# Patient Record
Sex: Male | Born: 1960 | Race: White | Hispanic: No | Marital: Married | Smoking: Never smoker
Health system: Southern US, Community
[De-identification: ages and names within clinical notes are randomized; demographics above are authoritative.]

## PROBLEM LIST (undated history)

## (undated) DIAGNOSIS — E785 Hyperlipidemia, unspecified: Secondary | ICD-10-CM

## (undated) DIAGNOSIS — G4730 Sleep apnea, unspecified: Secondary | ICD-10-CM

## (undated) DIAGNOSIS — E119 Type 2 diabetes mellitus without complications: Secondary | ICD-10-CM

## (undated) DIAGNOSIS — F4310 Post-traumatic stress disorder, unspecified: Secondary | ICD-10-CM

## (undated) DIAGNOSIS — K219 Gastro-esophageal reflux disease without esophagitis: Secondary | ICD-10-CM

## (undated) DIAGNOSIS — I10 Essential (primary) hypertension: Secondary | ICD-10-CM

## (undated) HISTORY — PX: TONSILLECTOMY: SUR1361

## (undated) HISTORY — PX: OTHER SURGICAL HISTORY: SHX169

## (undated) HISTORY — PX: ADENOIDECTOMY: SUR15

---

## 2007-04-16 ENCOUNTER — Ambulatory Visit: Payer: Self-pay | Admitting: Internal Medicine

## 2010-05-13 ENCOUNTER — Emergency Department: Payer: Self-pay | Admitting: Emergency Medicine

## 2010-11-07 ENCOUNTER — Emergency Department: Payer: Self-pay | Admitting: Emergency Medicine

## 2010-11-22 ENCOUNTER — Ambulatory Visit: Payer: Self-pay

## 2011-02-07 ENCOUNTER — Inpatient Hospital Stay: Payer: Self-pay | Admitting: Internal Medicine

## 2011-06-26 ENCOUNTER — Ambulatory Visit: Payer: Self-pay | Admitting: Internal Medicine

## 2011-07-04 ENCOUNTER — Ambulatory Visit: Payer: Self-pay | Admitting: Internal Medicine

## 2011-08-04 ENCOUNTER — Ambulatory Visit: Payer: Self-pay | Admitting: Internal Medicine

## 2011-09-01 ENCOUNTER — Ambulatory Visit: Payer: Self-pay | Admitting: Internal Medicine

## 2012-11-24 ENCOUNTER — Observation Stay: Payer: Self-pay | Admitting: Student

## 2012-11-24 LAB — CBC
HGB: 16.3 g/dL (ref 13.0–18.0)
MCH: 32.3 pg (ref 26.0–34.0)
Platelet: 180 10*3/uL (ref 150–440)
RBC: 5.05 10*6/uL (ref 4.40–5.90)
RDW: 12.9 % (ref 11.5–14.5)

## 2012-11-24 LAB — COMPREHENSIVE METABOLIC PANEL
Albumin: 3.7 g/dL (ref 3.4–5.0)
Alkaline Phosphatase: 117 U/L (ref 50–136)
Anion Gap: 6 — ABNORMAL LOW (ref 7–16)
BUN: 18 mg/dL (ref 7–18)
Calcium, Total: 8.8 mg/dL (ref 8.5–10.1)
Co2: 25 mmol/L (ref 21–32)
Creatinine: 0.92 mg/dL (ref 0.60–1.30)
Glucose: 234 mg/dL — ABNORMAL HIGH (ref 65–99)
Osmolality: 280 (ref 275–301)
Potassium: 3.6 mmol/L (ref 3.5–5.1)
SGOT(AST): 40 U/L — ABNORMAL HIGH (ref 15–37)
SGPT (ALT): 57 U/L (ref 12–78)
Sodium: 135 mmol/L — ABNORMAL LOW (ref 136–145)
Total Protein: 7 g/dL (ref 6.4–8.2)

## 2012-11-24 LAB — CK TOTAL AND CKMB (NOT AT ARMC): CK-MB: 2.5 ng/mL (ref 0.5–3.6)

## 2012-11-24 LAB — PRO B NATRIURETIC PEPTIDE: B-Type Natriuretic Peptide: 10 pg/mL (ref 0–125)

## 2012-11-24 LAB — TROPONIN I: Troponin-I: <0.02 ng/mL

## 2012-11-25 LAB — URINALYSIS, COMPLETE
Bilirubin,UR: NEGATIVE
Blood: NEGATIVE
Glucose,UR: >=500 mg/dL (ref 0–75)
Ph: 5 (ref 4.5–8.0)
Specific Gravity: 1.022 (ref 1.003–1.030)
Squamous Epithelial: 1

## 2012-11-25 LAB — TROPONIN I
Troponin-I: <0.02 ng/mL
Troponin-I: <0.02 ng/mL

## 2012-11-25 LAB — CBC WITH DIFFERENTIAL/PLATELET
Basophil #: 0.1 10*3/uL (ref 0.0–0.1)
Basophil %: 0.9 %
Eosinophil #: 0.4 10*3/uL (ref 0.0–0.7)
Eosinophil %: 5.4 %
HCT: 43.6 % (ref 40.0–52.0)
HGB: 15.7 g/dL (ref 13.0–18.0)
Lymphocyte #: 2.5 10*3/uL (ref 1.0–3.6)
Lymphocyte %: 36.1 %
MCH: 32.3 pg (ref 26.0–34.0)
MCHC: 36.1 g/dL — ABNORMAL HIGH (ref 32.0–36.0)
Neutrophil %: 48.5 %
RBC: 4.88 10*6/uL (ref 4.40–5.90)
RDW: 13 % (ref 11.5–14.5)
WBC: 6.9 10*3/uL (ref 3.8–10.6)

## 2012-11-25 LAB — LIPID PANEL: HDL Cholesterol: 18 mg/dL — ABNORMAL LOW (ref 40–60)

## 2012-11-25 LAB — BASIC METABOLIC PANEL
Anion Gap: 5 — ABNORMAL LOW (ref 7–16)
Co2: 29 mmol/L (ref 21–32)
Creatinine: 0.84 mg/dL (ref 0.60–1.30)
EGFR (Non-African Amer.): >60
Glucose: 251 mg/dL — ABNORMAL HIGH (ref 65–99)
Osmolality: 288 (ref 275–301)

## 2012-11-25 LAB — CK TOTAL AND CKMB (NOT AT ARMC)
CK, Total: 241 U/L — ABNORMAL HIGH (ref 35–232)
CK-MB: 1.6 ng/mL (ref 0.5–3.6)
CK-MB: 1.1 ng/mL (ref 0.5–3.6)

## 2012-11-25 LAB — MAGNESIUM: Magnesium: 1.8 mg/dL

## 2012-11-26 LAB — LIPID PANEL

## 2013-03-12 ENCOUNTER — Ambulatory Visit: Payer: Self-pay | Admitting: Family Medicine

## 2013-03-12 LAB — DOT URINE DIP: Blood: NEGATIVE

## 2013-07-27 ENCOUNTER — Emergency Department: Payer: Self-pay | Admitting: Internal Medicine

## 2013-09-05 ENCOUNTER — Ambulatory Visit: Payer: Self-pay | Admitting: Internal Medicine

## 2014-08-17 ENCOUNTER — Emergency Department: Payer: Self-pay | Admitting: Emergency Medicine

## 2014-10-23 NOTE — H&P (Signed)
PATIENT NAME:  Ross [name], Ross [name] MR#:  [id number] DATE OF BIRTH:  1961-07-03  DATE OF ADMISSION:  11/24/2012  REFERRING PHYSICIAN: Dr. Beather Arbour.   PRIMARY CARE PHYSICIAN: Dr. Ouida Sills at [facility].   CHIEF COMPLAINT: Chest pain.   HISTORY OF PRESENT ILLNESS: The patient is a morbidly obese Caucasian male with history of hypertension, hypertriglyceridemia, history of pancreatitis which the patient thinks is secondary to Victoza, diabetes who presents with chest pain. The pain has been off and on for the past 2 days. Initially, it started 2 nights ago with shooting to the left arm and neck. Lasted multiple hours and slowly resolved. This was associated with shortness of breath and dyspnea on exertion. It occurred the following night and persisted into today which he came into the hospital for. The pain has resolved since which he states is likely possibly secondary to either oxygen or nitroglycerin patch. He has a negative troponin. The hospitalist service was contacted for further evaluation and management.   PAST MEDICAL HISTORY:  1. Hypertension.  2. Hypertriglyceridemia.  3. History of nephrolithiasis.  4. History of obstructive sleep apnea, status post uvulectomy and tonsillectomy, not on CPAP.  5. Pancreatitis.  6. Type 2 diabetes.  7. Obesity.   ALLERGIES: PENICILLIN AND SULFA.   FAMILY HISTORY: Hypertension and CHF in mom. Brother with MI in his 21s. Father had possibly MI as well at a young age and had leukemia.   SOCIAL HISTORY: Retired from [employer]. He is married. No alcohol, tobacco or drug use.   OUTPATIENT MEDICATIONS: Aspirin 81 mg daily, glipizide 10 mg daily, hydrochlorothiazide 25 mg daily, losartan 50 mg 2 tabs once a day.   REVIEW OF SYSTEMS:   CONSTITUTIONAL: No fever or fatigue. No weight changes.  EYES: No blurry vision or double vision.  ENT: No tinnitus or hearing loss. Has snoring and obstructive sleep apnea.  RESPIRATORY: Has occasional cough,  occasional wheezing. Has dyspnea on exertion and shortness of breath.  CARDIOVASCULAR: Chest pain as above. No chest pain now. No swelling in the legs. Two pillow orthopnea. No history of syncope. Has high blood pressure and did have a bout of paroxysmal Afib multiple years ago which resolved.  GASTROINTESTINAL: No nausea, vomiting, diarrhea, abdominal pain. No hematemesis, melena or ulcers.  GENITOURINARY: Denies dysuria or hematuria.  HEMATOLOGIC AND LYMPHATIC: No anemia or easy bruising.  SKIN: No rashes.  MUSCULOSKELETAL: Denies arthritis or gout.  NEUROLOGIC: Denies focal weakness, numbness or stroke.  PSYCHIATRIC: Denies anxiety or insomnia.   PHYSICAL EXAMINATION:  VITALS: Temperature on arrival 98.5, pulse rate 78, respiratory rate 20, blood pressure 136/91, O2 sat 95% on room air.  GENERAL: The patient is an obese Caucasian male lying in bed in no obvious distress.  HEENT: Normocephalic, atraumatic. Pupils are equal and reactive. Anicteric sclerae. Extraocular muscles intact. Moist mucous membranes.  NECK: Supple. No thyroid tenderness. No cervical lymphadenopathy.  CARDIOVASCULAR: S1, S2 irregularly irregular. No murmurs, rubs or gallops.  LUNGS: Clear to auscultation. No wheezing, rhonchi or rales.  ABDOMEN: Soft, nontender, nondistended. Positive bowel sounds in all quadrants.  EXTREMITIES: No significant lower extremity edema.  NEUROLOGIC: Cranial nerves II through XII grossly intact. Strength is 5 out of 5 in all extremities. Sensation is intact to light touch.  SKIN: Without any significant rashes.   LABS: Glucose 234. BNP is 10. BUN 18, creatinine 0.95, sodium 135, potassium 3.6. LFTs: AST of 40, otherwise within normal limits. CK total mildly elevated at 225. CK-MB and troponins were negative.  WBC 7.9, platelets are 180, hemoglobin 16.3. D-dimer is 0.44. X-ray of the chest, portable, 1 view, showing no acute cardiopulmonary disease. Subcentimeter nodular density overlying the  left upper lung, possibly calcified. EKG: Normal sinus rhythm, rate is 75. Left axis deviation. Pulmonary disease pattern. No acute ST elevations or depressions.   ASSESSMENT AND PLAN: We have a 54 year old male with diabetes, hypertension, hypertriglyceridemia, morbid obesity with some family history of early coronary artery disease and myocardial infarction who presents with chest pain concerning for unstable angina. The chest pain has been going on for a couple of days off and on, resolved now. Would admit the patient to rule out acute coronary syndrome to telemetry with cycling of the troponins. I would add a low-dose beta blocker to the patient's medications. Obtain an echocardiogram and stress test if the patient is ruled out for myocardial infarction. I would check a lipid profile as well as hemoglobin A1c and a TSH. Start him on morphine p.r.n. for pain and also a nitroglycerin patch. As far as the diabetes, again I would check a hemoglobin A1c. Hold oral agent and start sliding scale insulin. His blood pressure appears to be stable. The patient also will be started on Lovenox 1 mg/kg b.i.d. dose for now as he is deemed to have a high risk for progression to myocardial infarction. Would consider cardiology consult if troponins turn positive for possible catheterization.    TOTAL TIME SPENT: 45 minutes.   CODE STATUS: FULL CODE.   ____________________________ Vivien Presto, MD sa:gb D: 11/24/2012 21:51:00 ET T: 11/25/2012 00:37:28 ET JOB#: [id number]  cc: Vivien Presto, MD, <Dictator> Dr. Ouida Sills at [place] MD ELECTRONICALLY SIGNED 12/03/2012 20:06

## 2014-10-23 NOTE — Discharge Summary (Signed)
PATIENT NAME:  Ross [name], REGIS MR#:  [id number] DATE OF BIRTH:  1961/04/09  DATE OF ADMISSION:  11/24/2012 DATE OF DISCHARGE:  11/27/2012  DISCHARGE DIAGNOSES:  1. Atypical chest pain. 2. Hypertension.  3. Hypertriglyceridemia.  4. Type 2 diabetes.   DISCHARGE MEDICATIONS: Per medication reconciliation in [facility] system.   HOSPITAL COURSE: The patient was observed for atypical chest pain. He ruled out for myocardial infarction by cardiac enzymes. Myocardial stress test was done, but could not be done the day after admission secondary to it being Sunday. It was 2 days following that, so his observation status was lengthened. There were no abnormalities. He had no further chest pain, was doing well and was discharged home with increase in his diabetes therapy and close followup for continued hypertriglyceridemia.   ____________________________ Ocie Cornfield. Ouida Sills, MD mwa:OSi D: 12/10/2012 08:48:03 ET T: 12/10/2012 11:38:47 ET JOB#: [id number]  cc: Ocie Cornfield. Ouida Sills, MD, <Dictator> Kirk Ruths MD ELECTRONICALLY SIGNED 12/10/2012 21:50

## 2015-04-25 ENCOUNTER — Emergency Department: Payer: BC Managed Care – PPO

## 2015-04-25 ENCOUNTER — Emergency Department
Admission: EM | Admit: 2015-04-25 | Discharge: 2015-04-25 | Disposition: A | Discharge status: Home / Self Care | Payer: BC Managed Care – PPO | Attending: Emergency Medicine | Admitting: Emergency Medicine

## 2015-04-25 DIAGNOSIS — R110 Nausea: Secondary | ICD-10-CM | POA: Insufficient documentation

## 2015-04-25 DIAGNOSIS — R1013 Epigastric pain: Secondary | ICD-10-CM | POA: Insufficient documentation

## 2015-04-25 DIAGNOSIS — Z880 Allergy status to penicillin: Secondary | ICD-10-CM | POA: Insufficient documentation

## 2015-04-25 DIAGNOSIS — E119 Type 2 diabetes mellitus without complications: Secondary | ICD-10-CM | POA: Insufficient documentation

## 2015-04-25 DIAGNOSIS — R109 Unspecified abdominal pain: Secondary | ICD-10-CM | POA: Diagnosis present

## 2015-04-25 LAB — COMPREHENSIVE METABOLIC PANEL
ALK PHOS: 79 U/L (ref 38–126)
ALT: 49 U/L (ref 17–63)
AST: 33 U/L (ref 15–41)
Albumin: 4.1 g/dL (ref 3.5–5.0)
Anion gap: 6 (ref 5–15)
BILIRUBIN TOTAL: 0.8 mg/dL (ref 0.3–1.2)
BUN: 24 mg/dL — AB (ref 6–20)
CALCIUM: 9.2 mg/dL (ref 8.9–10.3)
CO2: 25 mmol/L (ref 22–32)
Chloride: 103 mmol/L (ref 101–111)
Creatinine, Ser: 1.23 mg/dL (ref 0.61–1.24)
GFR calc Af Amer: >60 mL/min (ref >60)
Glucose, Bld: 287 mg/dL — ABNORMAL HIGH (ref 65–99)
POTASSIUM: 3.5 mmol/L (ref 3.5–5.1)
Sodium: 134 mmol/L — ABNORMAL LOW (ref 135–145)
TOTAL PROTEIN: 6.8 g/dL (ref 6.5–8.1)

## 2015-04-25 LAB — LIPASE, BLOOD: Lipase: 23 U/L (ref 11–51)

## 2015-04-25 LAB — CBC
HEMATOCRIT: 48.5 % (ref 40.0–52.0)
Hemoglobin: 16.6 g/dL (ref 13.0–18.0)
MCH: 30.9 pg (ref 26.0–34.0)
MCHC: 34.3 g/dL (ref 32.0–36.0)
MCV: 90.2 fL (ref 80.0–100.0)
PLATELETS: 184 10*3/uL (ref 150–440)
RBC: 5.38 MIL/uL (ref 4.40–5.90)
RDW: 13 % (ref 11.5–14.5)
WBC: 6.9 10*3/uL (ref 3.8–10.6)

## 2015-04-25 LAB — TROPONIN I

## 2015-04-25 MED ORDER — MORPHINE SULFATE (PF) 4 MG/ML IV SOLN
4.0000 mg | Freq: Once | INTRAVENOUS | Status: DC
Start: 2015-04-25 — End: 2015-04-25

## 2015-04-25 MED ORDER — ONDANSETRON HCL 4 MG/2ML IJ SOLN
4.0000 mg | Freq: Once | INTRAMUSCULAR | Status: AC
  Administered 2015-04-25: 4 mg via INTRAVENOUS
  Filled 2015-04-25: qty 2

## 2015-04-25 MED ORDER — HYDROMORPHONE HCL 1 MG/ML IJ SOLN
1.0000 mg | Freq: Once | INTRAMUSCULAR | Status: AC
  Administered 2015-04-25: 1 mg via INTRAVENOUS
  Filled 2015-04-25: qty 1

## 2015-04-25 NOTE — ED Notes (Signed)
Patient reports upper abd pain and in to his back.  Patient reports having pancreatitis in 2012.

## 2015-04-25 NOTE — Discharge Instructions (Signed)
1. Eat a bland diet this weekend, and slowly advance diet as tolerated. 2. Take over-the-counter Prilosec or Zantac as needed. 3. Return to the ER for worsening symptoms, persistent vomiting, difficulty breathing, fainting or other concerns.  Abdominal Pain, Adult Many things can cause abdominal pain. Usually, abdominal pain is not caused by a disease and will improve without treatment. It can often be observed and treated at home. Your health care provider will do a physical exam and possibly order blood tests and X-rays to help determine the seriousness of your pain. However, in many cases, more time must pass before a clear cause of the pain can be found. Before that point, your health care provider may not know if you need more testing or further treatment. HOME CARE INSTRUCTIONS Monitor your abdominal pain for any changes. The following actions may help to alleviate any discomfort you are experiencing:  Only take over-the-counter or prescription medicines as directed by your health care provider.  Do not take laxatives unless directed to do so by your health care provider.  Try a clear liquid diet (broth, tea, or water) as directed by your health care provider. Slowly move to a bland diet as tolerated. SEEK MEDICAL CARE IF:  You have unexplained abdominal pain.  You have abdominal pain associated with nausea or diarrhea.  You have pain when you urinate or have a bowel movement.  You experience abdominal pain that wakes you in the night.  You have abdominal pain that is worsened or improved by eating food.  You have abdominal pain that is worsened with eating fatty foods.  You have a fever. SEEK IMMEDIATE MEDICAL CARE IF:  Your pain does not go away within 2 hours.  You keep throwing up (vomiting).  Your pain is felt only in portions of the abdomen, such as the right side or the left lower portion of the abdomen.  You pass bloody or black tarry stools. MAKE SURE  YOU:  Understand these instructions.  Will watch your condition.  Will get help right away if you are not doing well or get worse.   This information is not intended to replace advice given to you by your health care provider. Make sure you discuss any questions you have with your health care provider.   Document Released: 03/29/2005 Document Revised: 03/10/2015 Document Reviewed: 02/26/2013 Elsevier Interactive Patient Education [employer].

## 2015-04-25 NOTE — ED Provider Notes (Signed)
[facility] Emergency Department Provider Note  ____________________________________________  Time seen: Approximately 3:29 AM  I have reviewed the triage vital signs and the nursing notes.   HISTORY  Chief Complaint Abdominal Pain    HPI Ross [name] is a 54 y.o. male who presents to the ED from home with a chief complaint of abdominal pain. Patient awoke approximately midnight with epigastric discomfort radiating around to his back.Describes sharp and aching type pain associated with some burning and nausea. Similar symptoms previously with pancreatitis in 2012. Ate cheeseburger for dinner. Denies recent fever, chills, chest pain, shortness of breath, diarrhea. Has chronic constipation symptoms and "stays up ahead of it". States he had 2 bowel movements yesterday. Denies recent travel or trauma. Denies alcohol use. Prior pancreatitis attributed to his use of Victoza. Nothing makes his pain better or worse.   Past medical history Diabetes   There are no active problems to display for this patient.   Past surgical history Negative for cholecystectomy   No current outpatient prescriptions on file.  Allergies Morphine and related; Penicillins; and Sulfa antibiotics  No family history on file.  Social History Social History  Substance Use Topics  . Smoking status: Not on file  . Smokeless tobacco: Not on file  . Alcohol Use: Not on file  Nondrinker  Review of Systems Constitutional: No fever/chills Eyes: No visual changes. ENT: No sore throat. Cardiovascular: Denies chest pain. Respiratory: Denies shortness of breath. Gastrointestinal: Positive for abdominal pain.  Positive for nausea, no vomiting.  No diarrhea.  No constipation. Genitourinary: Negative for dysuria. Musculoskeletal: Negative for back pain. Skin: Negative for rash. Neurological: Negative for headaches, focal weakness or numbness.  10-point ROS otherwise  negative.  ____________________________________________   PHYSICAL EXAM:  VITAL SIGNS: ED Triage Vitals  Enc Vitals Group     BP 04/25/15 0128 133/90 mmHg     Pulse Rate 04/25/15 0128 83     Resp 04/25/15 0128 20     Temp 04/25/15 0128 98.4 F (36.9 C)     Temp Source 04/25/15 0128 Oral     SpO2 04/25/15 0128 96 %     Weight 04/25/15 0128 310 lb (140.615 kg)     Height 04/25/15 0128 6\' 3"  (1.905 m)     Head Cir --      Peak Flow --      Pain Score --      Pain Loc --      Pain Edu? --      Excl. in [place]? --     Constitutional: Alert and oriented. Well appearing and in no acute distress. Eyes: Conjunctivae are normal. PERRL. EOMI. Head: Atraumatic. Nose: No congestion/rhinnorhea. Mouth/Throat: Mucous membranes are moist.  Oropharynx non-erythematous. Neck: No stridor. No carotid bruits. Cardiovascular: Normal rate, regular rhythm. Grossly normal heart sounds.  Good peripheral circulation. Respiratory: Normal respiratory effort.  No retractions. Lungs CTAB. Gastrointestinal: Soft and mildly tender to palpation epigastrium without rebound or guarding. No distention. No abdominal bruits. No CVA tenderness. 2+ femoral pulses bilaterally. Reducible umbilical hernia. Musculoskeletal: No lower extremity tenderness nor edema.  No joint effusions. Neurologic:  Normal speech and language. No gross focal neurologic deficits are appreciated. No gait instability. Skin:  Skin is warm, dry and intact. No rash noted. Psychiatric: Mood and affect are normal. Speech and behavior are normal.  ____________________________________________   LABS (all labs ordered are listed, but only abnormal results are displayed)  Labs Reviewed  COMPREHENSIVE METABOLIC PANEL - Abnormal; Notable  for the following:    Sodium 134 (*)    Glucose, Bld 287 (*)    BUN 24 (*)    All other components within normal limits  LIPASE, BLOOD  CBC  TROPONIN I    ____________________________________________  EKG  ED ECG REPORT I, SUNG,JADE J, the attending physician, personally viewed and interpreted this ECG.   Date: 04/25/2015  EKG Time: 0134  Rate: 81  Rhythm: normal EKG, normal sinus rhythm  Axis: LAD  Intervals:none  ST&T Change: Nonspecific  ____________________________________________  RADIOLOGY  Ultrasound abdomen limited interpreted per Dr. Radene Knee: 1. No acute abnormality seen at the right upper quadrant. 2. Diffuse fatty infiltration within the liver. 3. Tiny hepatic cyst noted. ____________________________________________   PROCEDURES  Procedure(s) performed: None  Critical Care performed: No  ____________________________________________   INITIAL IMPRESSION / ASSESSMENT AND PLAN / ED COURSE  Pertinent labs & imaging results that were available during my care of the patient were reviewed by me and considered in my medical decision making (see chart for details).  54 year old male who presents with epigastric pain, prior history of nonalcoholic pancreatitis. Laboratory results unremarkable including normal LFTs and lipase. Will administer IV analgesia and obtain upper abdominal ultrasound to evaluate for cholelithiasis. Low suspicion for AAA given no pulsatile mass or bruit found on exam.  ----------------------------------------- 7:30 AM on 04/25/2015 -----------------------------------------  Patient sleeping in no acute distress. Awoke patient update him and his wife of ultrasound findings. Patient denies any pain or discomfort currently. Reexamined abdomen which is now nontender to palpation. Advised bland diet for the next several days as well as OTC H2 blockers. Strict return precautions given. Both verbalize understanding and agree with plan of care. ____________________________________________   FINAL CLINICAL IMPRESSION(S) / ED DIAGNOSES  Final diagnoses:  Epigastric pain      Paulette Blanch,  MD 04/25/15 [phone]

## 2015-04-25 NOTE — ED Notes (Signed)
Patient transported to Ultrasound 

## 2015-04-26 ENCOUNTER — Emergency Department: Payer: BC Managed Care – PPO

## 2015-04-26 ENCOUNTER — Emergency Department
Admission: EM | Admit: 2015-04-26 | Discharge: 2015-04-26 | Disposition: A | Discharge status: Home / Self Care | Payer: BC Managed Care – PPO | Attending: Emergency Medicine | Admitting: Emergency Medicine

## 2015-04-26 DIAGNOSIS — Z880 Allergy status to penicillin: Secondary | ICD-10-CM | POA: Insufficient documentation

## 2015-04-26 DIAGNOSIS — R739 Hyperglycemia, unspecified: Secondary | ICD-10-CM

## 2015-04-26 DIAGNOSIS — Z79899 Other long term (current) drug therapy: Secondary | ICD-10-CM | POA: Diagnosis not present

## 2015-04-26 DIAGNOSIS — Z7982 Long term (current) use of aspirin: Secondary | ICD-10-CM | POA: Diagnosis not present

## 2015-04-26 DIAGNOSIS — D3500 Benign neoplasm of unspecified adrenal gland: Secondary | ICD-10-CM | POA: Insufficient documentation

## 2015-04-26 DIAGNOSIS — R1013 Epigastric pain: Secondary | ICD-10-CM

## 2015-04-26 DIAGNOSIS — E1165 Type 2 diabetes mellitus with hyperglycemia: Secondary | ICD-10-CM | POA: Diagnosis not present

## 2015-04-26 DIAGNOSIS — K859 Acute pancreatitis without necrosis or infection, unspecified: Secondary | ICD-10-CM | POA: Diagnosis not present

## 2015-04-26 DIAGNOSIS — I10 Essential (primary) hypertension: Secondary | ICD-10-CM | POA: Insufficient documentation

## 2015-04-26 DIAGNOSIS — K429 Umbilical hernia without obstruction or gangrene: Secondary | ICD-10-CM | POA: Insufficient documentation

## 2015-04-26 DIAGNOSIS — R1010 Upper abdominal pain, unspecified: Secondary | ICD-10-CM | POA: Diagnosis present

## 2015-04-26 HISTORY — DX: Type 2 diabetes mellitus without complications: E11.9

## 2015-04-26 HISTORY — DX: Essential (primary) hypertension: I10

## 2015-04-26 LAB — URINALYSIS COMPLETE WITH MICROSCOPIC (ARMC ONLY)
Bacteria, UA: NONE SEEN
Bilirubin Urine: NEGATIVE
Hgb urine dipstick: NEGATIVE
Leukocytes, UA: NEGATIVE
Nitrite: NEGATIVE
PROTEIN: NEGATIVE mg/dL
SPECIFIC GRAVITY, URINE: 1.029 (ref 1.005–1.030)
pH: 6 (ref 5.0–8.0)

## 2015-04-26 LAB — COMPREHENSIVE METABOLIC PANEL
ALBUMIN: 4.3 g/dL (ref 3.5–5.0)
ALT: 60 U/L (ref 17–63)
ANION GAP: 8 (ref 5–15)
AST: 38 U/L (ref 15–41)
Alkaline Phosphatase: 91 U/L (ref 38–126)
BUN: 18 mg/dL (ref 6–20)
CHLORIDE: 105 mmol/L (ref 101–111)
CO2: 24 mmol/L (ref 22–32)
Calcium: 9.8 mg/dL (ref 8.9–10.3)
Creatinine, Ser: 1.17 mg/dL (ref 0.61–1.24)
GFR calc Af Amer: >60 mL/min (ref >60)
GFR calc non Af Amer: >60 mL/min (ref >60)
GLUCOSE: 250 mg/dL — AB (ref 65–99)
POTASSIUM: 3.8 mmol/L (ref 3.5–5.1)
SODIUM: 137 mmol/L (ref 135–145)
Total Bilirubin: 0.8 mg/dL (ref 0.3–1.2)
Total Protein: 7.5 g/dL (ref 6.5–8.1)

## 2015-04-26 LAB — LIPASE, BLOOD: Lipase: 67 U/L — ABNORMAL HIGH (ref 11–51)

## 2015-04-26 LAB — CBC
HCT: 49.1 % (ref 40.0–52.0)
HEMOGLOBIN: 16.9 g/dL (ref 13.0–18.0)
MCH: 31.1 pg (ref 26.0–34.0)
MCHC: 34.5 g/dL (ref 32.0–36.0)
MCV: 90.4 fL (ref 80.0–100.0)
PLATELETS: 184 10*3/uL (ref 150–440)
RBC: 5.43 MIL/uL (ref 4.40–5.90)
RDW: 12.6 % (ref 11.5–14.5)
WBC: 6.8 10*3/uL (ref 3.8–10.6)

## 2015-04-26 LAB — TROPONIN I: Troponin I: <0.03 ng/mL (ref <0.031)

## 2015-04-26 MED ORDER — HYDROMORPHONE HCL 2 MG PO TABS
2.0000 mg | ORAL_TABLET | Freq: Four times a day (QID) | ORAL | Status: DC | PRN
Start: 2015-04-26 — End: 2015-05-17

## 2015-04-26 MED ORDER — SODIUM CHLORIDE 0.9 % IV BOLUS (SEPSIS)
1000.0000 mL | Freq: Once | INTRAVENOUS | Status: AC
  Administered 2015-04-26: 1000 mL via INTRAVENOUS

## 2015-04-26 MED ORDER — IOHEXOL 240 MG/ML SOLN
25.0000 mL | Freq: Once | INTRAMUSCULAR | Status: AC | PRN
  Administered 2015-04-26: 25 mL via ORAL

## 2015-04-26 MED ORDER — HYDROMORPHONE HCL 1 MG/ML IJ SOLN
1.0000 mg | Freq: Once | INTRAMUSCULAR | Status: AC
  Administered 2015-04-26: 1 mg via INTRAVENOUS

## 2015-04-26 MED ORDER — HYDROMORPHONE HCL 1 MG/ML IJ SOLN
INTRAMUSCULAR | Status: AC
  Filled 2015-04-26: qty 1

## 2015-04-26 MED ORDER — ONDANSETRON HCL 4 MG/2ML IJ SOLN
INTRAMUSCULAR | Status: AC
  Filled 2015-04-26: qty 2

## 2015-04-26 MED ORDER — FENTANYL CITRATE (PF) 100 MCG/2ML IJ SOLN
50.0000 ug | Freq: Once | INTRAMUSCULAR | Status: AC
  Administered 2015-04-26: 50 ug via INTRAVENOUS
  Filled 2015-04-26: qty 2

## 2015-04-26 MED ORDER — IOHEXOL 300 MG/ML  SOLN
100.0000 mL | Freq: Once | INTRAMUSCULAR | Status: AC | PRN
  Administered 2015-04-26: 100 mL via INTRAVENOUS

## 2015-04-26 MED ORDER — ONDANSETRON HCL 4 MG/2ML IJ SOLN
4.0000 mg | Freq: Once | INTRAMUSCULAR | Status: AC
  Administered 2015-04-26: 4 mg via INTRAVENOUS

## 2015-04-26 MED ORDER — ACETAMINOPHEN 500 MG PO TABS
1000.0000 mg | ORAL_TABLET | Freq: Once | ORAL | Status: AC
  Administered 2015-04-26: 1000 mg via ORAL
  Filled 2015-04-26: qty 2

## 2015-04-26 MED ORDER — ONDANSETRON 4 MG PO TBDP: 4.0000 mg | ORAL_TABLET | Freq: Three times a day (TID) | ORAL | Status: AC | PRN

## 2015-04-26 MED ORDER — HYDROMORPHONE HCL 1 MG/ML IJ SOLN: 1.0000 mg | Freq: Once | INTRAMUSCULAR | Status: DC

## 2015-04-26 NOTE — ED Notes (Signed)
Patient transported to CT 

## 2015-04-26 NOTE — ED Notes (Signed)
Pt completes contrast at this time. CT tech notified.

## 2015-04-26 NOTE — ED Notes (Signed)
Pt will attempt to drink contrast for ct scan at this time. Pt reports pain again, edp notified, no new orders at this time. Pt encourage to drink contrast for ct scan. Continue to monitor.

## 2015-04-26 NOTE — ED Notes (Signed)
Verbal report given to [facility]. Pt up for discharge with rx on chart.

## 2015-04-26 NOTE — ED Notes (Signed)
CT tech to bedside with PO contrast....allergies reviewed with patient...instructions for administration of PO contrast reviewed with patient -- patient verbalizes understanding of process. RN to f/u with and encourage patient to consume PO contrast volume. Patient to notify RN when volume completed and for any difficulties experienced while drinking --Patient verbalizes understanding  

## 2015-04-26 NOTE — ED Notes (Signed)
Pt reports abd pain centralized in the mid abd 10/10. edp aware. Ct results pending then will treat pain accordingly.

## 2015-04-26 NOTE — ED Notes (Signed)
Pt returns from ct at this time.  

## 2015-04-26 NOTE — ED Notes (Signed)
Dr Dahlia Client in to see pt; O2 sat now 89% on ra; O2 placed at 2l/min via [place]

## 2015-04-26 NOTE — ED Provider Notes (Signed)
54 y.o. male with a history of pancreatitis presenting with epigastric pain, with dry heaves associated with nausea but no vomiting since yesterday. The patient has a mildly elevated lipase at 61, he is afebrile and has stable vital signs. Clinically he is nontoxic appearing and has a mild epigastric tenderness to palpation. He does have an umbilical hernia that is reducible and nontender. Patient's CT scan does not show any acute pathology; I did discuss the other findings including umbilical and inguinal hernias, adrenal adenoma with the patient and his wife. They will follow up with her primary care physician to follow up the incidental findings on his film. He is tolerating by mouth at this time and pain is continued but improved compared to arrival. He has no nausea at this time. I will plan discharge home. We discussed return precautions and follow-up instructions.  Eula Listen, MD 04/26/15 1040

## 2015-04-26 NOTE — ED Notes (Signed)
Patient reports abdominal pain that started around 2am.  Patient seen in ED Saturday night for same.  History of pancreatitis.

## 2015-04-26 NOTE — Discharge Instructions (Signed)
Today, your pain is most likely from pancreatitis. Please take a clear liquid diet for the next 12-24 hours then advance to full liquid diet as tolerated. After that, if you're able to tolerate a full liquid diet, please advance to a bland BRAT diet. You may take Tylenol or Motrin for mild to moderate pain, and Dilaudid for severe pain. Do not drive within 8 hours of taking Dilaudid. He may take Zofran for nausea. Please drink plenty of fluids stay well hydrated.  Today, there were multiple additional findings on your abdominal CT scan that do not relate to your pancreatitis. Please make an appointment with her primary care physician to discuss any follow-up or monitoring that needs to occur for these findings. Next para please return to the emergency department if he develops severe pain, inability to keep down fluids, fever, or any other symptoms concerning to you.  Acute Pancreatitis Acute pancreatitis is a disease in which the pancreas becomes suddenly irritated (inflamed). The pancreas is a large gland behind your stomach. The pancreas makes enzymes that help digest food. The pancreas also makes 2 hormones that help control your blood sugar. Acute pancreatitis happens when the enzymes attack and damage the pancreas. Most attacks last a couple of days and can cause serious problems. HOME CARE  Follow your doctor's diet instructions. You may need to avoid alcohol and limit fat in your diet.  Eat small meals often.  Drink enough fluids to keep your pee (urine) clear or pale yellow.  Only take medicines as told by your doctor.  Avoid drinking alcohol if it caused your disease.  Do not smoke.  Get plenty of rest.  Check your blood sugar at home as told by your doctor.  Keep all doctor visits as told. GET HELP IF:  You do not get better as quickly as expected.  You have new or worsening symptoms.  You have lasting pain, weakness, or feel sick to your stomach (nauseous).  You get  better and then have another pain attack. GET HELP RIGHT AWAY IF:   You are unable to eat or keep fluids down.  Your pain becomes severe.  You have a fever or lasting symptoms for more than 2 to 3 days.  You have a fever and your symptoms suddenly get worse.  Your skin or the white part of your eyes turn yellow (jaundice).  You throw up (vomit).  You feel dizzy, or you pass out (faint).  Your blood sugar is high (over 300 mg/dL). MAKE SURE YOU:   Understand these instructions.  Will watch your condition.  Will get help right away if you are not doing well or get worse.   This information is not intended to replace advice given to you by your health care provider. Make sure you discuss any questions you have with your health care provider.   Document Released: 12/06/2007 Document Revised: 07/10/2014 Document Reviewed: 09/28/2011 Elsevier Interactive Patient Education [employer].

## 2015-04-26 NOTE — ED Provider Notes (Signed)
[facility] Emergency Department Provider Note  ____________________________________________  Time seen: Approximately 6:00 AM  I have reviewed the triage vital signs and the nursing notes.   HISTORY  Chief Complaint Abdominal Pain    HPI Ross [name] is a 54 y.o. male who started having abdominal pain yesterday. The patient reports that this pain in his upper abdomen that goes through to his back. The patient has had pancreatitis in the past and feels as though it is similar to his episodes of pancreatitis. The patient reports that he was seen in the emergency department had some blood work which was unremarkable and an ultrasound which was negative. The patient went home and was doing okay until about 2 AM this morning when the pain returned worse than it was the night before. The patient and his wife waited to see if the pain would get better but it did not. The patient has had some dry heaves with no frank vomiting. The patient denies any diarrhea or pain with urination. He has had no sick contacts no chest pain or shortness of breath. The patient reports that the pain was very intense so he decided to come back in for further evaluation.The patient rates his pain a 10 out of 10 in intensity.   Past medical history Diabetes Atrial fibrillation Hypertension Pancreatitis   There are no active problems to display for this patient.   No past surgical history   Current Outpatient Rx  Name  Route  Sig  Dispense  Refill  . aspirin EC 81 MG tablet   Oral   Take 81 mg by mouth daily.         Marland Kitchen atorvastatin (LIPITOR) 20 MG tablet   Oral   Take 20 mg by mouth at bedtime.         . fenofibrate (TRICOR) 145 MG tablet   Oral   Take 145 mg by mouth daily.         Marland Kitchen glipiZIDE (GLUCOTROL) 10 MG tablet   Oral   Take 10 mg by mouth 2 (two) times daily.         . INVOKANA 300 MG TABS tablet   Oral   Take 300 mg by mouth daily.          Dispense as written.   Marland Kitchen losartan-hydrochlorothiazide (HYZAAR) 100-25 MG tablet   Oral   Take 1 tablet by mouth daily.         Marland Kitchen senna (SENOKOT) 8.6 MG tablet   Oral   Take 2 tablets by mouth every morning.           Allergies Morphine and related; Penicillins; and Sulfa antibiotics  No family history on file.  Social History Social History  Substance Use Topics  . Smoking status: None  . Smokeless tobacco: None  . Alcohol Use: None    Review of Systems Constitutional: No fever/chills Eyes: No visual changes. ENT: No sore throat. Cardiovascular: Denies chest pain. Respiratory: Denies shortness of breath. Gastrointestinal: abdominal pain. With nausea, and vomiting.  No diarrhea.  No constipation. Genitourinary: Negative for dysuria. Musculoskeletal:  back pain. Skin: Negative for rash. Neurological: Negative for headaches, focal weakness or numbness.  10-point ROS otherwise negative.  ____________________________________________   PHYSICAL EXAM:  VITAL SIGNS: ED Triage Vitals  Enc Vitals Group     BP 04/26/15 0513 156/95 mmHg     Pulse Rate 04/26/15 0513 79     Resp 04/26/15 0513 22  Temp 04/26/15 0513 98.4 F (36.9 C)     Temp Source 04/26/15 0513 Oral     SpO2 04/26/15 0514 93 %     Weight 04/26/15 0513 210 lb (95.255 kg)     Height 04/26/15 0513 6\' 2"  (1.88 m)     Head Cir --      Peak Flow --      Pain Score 04/26/15 0600 10     Pain Loc --      Pain Edu? --      Excl. in [place]? --     Constitutional: Alert and oriented. Well appearing and in no acute distress. Eyes: Conjunctivae are normal. PERRL. EOMI. Head: Atraumatic. Nose: No congestion/rhinnorhea. Mouth/Throat: Mucous membranes are moist.  Oropharynx non-erythematous. Cardiovascular: Normal rate, regular rhythm. Grossly normal heart sounds.  Good peripheral circulation. Respiratory: Normal respiratory effort.  No retractions. Lungs CTAB. Gastrointestinal: Soft with upper abd  tenderness to palpation. No distention. Positive bowel sounds Musculoskeletal: No lower extremity tenderness nor edema.   Neurologic:  Normal speech and language. No gross focal neurologic deficits are appreciated. No gait instability. Skin:  Skin is warm, dry and intact. No rash noted. Psychiatric: Mood and affect are normal. Speech and behavior are normal.  ____________________________________________   LABS (all labs ordered are listed, but only abnormal results are displayed)  Labs Reviewed  LIPASE, BLOOD  COMPREHENSIVE METABOLIC PANEL  CBC  TROPONIN I  URINALYSIS COMPLETEWITH MICROSCOPIC ([place])   ____________________________________________  EKG  ED ECG REPORT I, Loney Hering, the attending physician, personally viewed and interpreted this ECG.   Date: 04/26/2015  EKG Time: 546  Rate: 79  Rhythm: normal sinus rhythm  Axis: none  Intervals:none  ST&T Change: none  ____________________________________________  RADIOLOGY  CT abd and pelvis ____________________________________________   PROCEDURES  Procedure(s) performed: None  Critical Care performed: No  ____________________________________________   INITIAL IMPRESSION / ASSESSMENT AND PLAN / ED COURSE  Pertinent labs & imaging results that were available during my care of the patient were reviewed by me and considered in my medical decision making (see chart for details).  This is a 54 year old male who comes in with some upper abdominal pain has been going on for the last 2 days. The patient was seen in the ED and the pain did improve but he reports that it returned this morning. The patient is in some severe discomforts I will give him a dose of Dilaudid as well as some Zofran. I will also give the patient liter of normal saline. The patient received a CT scan and he'll be reassessed once he received his scans.  At this time we are awaiting the results of the patient's CT scan. The patient's  care was signed out to Dr. Mariea Clonts who will follow-up the results of the patient's CT scan. ____________________________________________   FINAL CLINICAL IMPRESSION(S) / ED DIAGNOSES  Final diagnoses:  None      Loney Hering, MD 04/26/15 [phone]

## 2015-04-26 NOTE — ED Notes (Signed)
Pt uprite on stretcher in exam room, appears uncomfortable--grimacing, dry heaving; reports being seen here Saturday for abd pain and u/s WNL and r/o pancreatitis for which he has had an episode of in 2012 from possible victoza; st received meds with relief here but awoke at 2am with return of sharp mid abd pain radiating thru to back; denies any vomiting; denies urinary c/o and reports had normal BM PTA; +BS, abd distended and tender to mid abd

## 2015-05-05 ENCOUNTER — Other Ambulatory Visit: Payer: Self-pay | Admitting: Nurse Practitioner

## 2015-05-05 DIAGNOSIS — K760 Fatty (change of) liver, not elsewhere classified: Secondary | ICD-10-CM

## 2015-05-07 ENCOUNTER — Ambulatory Visit: Admission: RE | Admit: 2015-05-07 | Payer: BC Managed Care – PPO | Source: Ambulatory Visit

## 2015-05-07 ENCOUNTER — Ambulatory Visit
Admission: RE | Admit: 2015-05-07 | Discharge: 2015-05-07 | Disposition: A | Discharge status: Home / Self Care | Payer: BC Managed Care – PPO | Source: Ambulatory Visit | Attending: Nurse Practitioner | Admitting: Nurse Practitioner

## 2015-05-07 DIAGNOSIS — K760 Fatty (change of) liver, not elsewhere classified: Secondary | ICD-10-CM | POA: Diagnosis not present

## 2015-05-14 ENCOUNTER — Inpatient Hospital Stay: Payer: BC Managed Care – PPO

## 2015-05-14 ENCOUNTER — Inpatient Hospital Stay
Admission: AD | Admit: 2015-05-14 | Discharge: 2015-05-17 | DRG: 440 | Disposition: A | Discharge status: Home / Self Care | Payer: BC Managed Care – PPO | Source: Ambulatory Visit | Attending: Internal Medicine | Admitting: Internal Medicine

## 2015-05-14 ENCOUNTER — Encounter: Payer: Self-pay | Admitting: Gastroenterology

## 2015-05-14 ENCOUNTER — Encounter: Payer: Self-pay | Admitting: *Deleted

## 2015-05-14 DIAGNOSIS — E119 Type 2 diabetes mellitus without complications: Secondary | ICD-10-CM | POA: Diagnosis present

## 2015-05-14 DIAGNOSIS — Z833 Family history of diabetes mellitus: Secondary | ICD-10-CM | POA: Diagnosis not present

## 2015-05-14 DIAGNOSIS — R1013 Epigastric pain: Secondary | ICD-10-CM

## 2015-05-14 DIAGNOSIS — K7581 Nonalcoholic steatohepatitis (NASH): Secondary | ICD-10-CM | POA: Diagnosis present

## 2015-05-14 DIAGNOSIS — E785 Hyperlipidemia, unspecified: Secondary | ICD-10-CM | POA: Diagnosis present

## 2015-05-14 DIAGNOSIS — F4310 Post-traumatic stress disorder, unspecified: Secondary | ICD-10-CM | POA: Diagnosis present

## 2015-05-14 DIAGNOSIS — K859 Acute pancreatitis without necrosis or infection, unspecified: Secondary | ICD-10-CM | POA: Diagnosis present

## 2015-05-14 DIAGNOSIS — Z7984 Long term (current) use of oral hypoglycemic drugs: Secondary | ICD-10-CM

## 2015-05-14 DIAGNOSIS — Z79899 Other long term (current) drug therapy: Secondary | ICD-10-CM | POA: Diagnosis not present

## 2015-05-14 DIAGNOSIS — I10 Essential (primary) hypertension: Secondary | ICD-10-CM | POA: Diagnosis present

## 2015-05-14 DIAGNOSIS — R1011 Right upper quadrant pain: Secondary | ICD-10-CM

## 2015-05-14 DIAGNOSIS — Z8249 Family history of ischemic heart disease and other diseases of the circulatory system: Secondary | ICD-10-CM

## 2015-05-14 DIAGNOSIS — K219 Gastro-esophageal reflux disease without esophagitis: Secondary | ICD-10-CM | POA: Diagnosis present

## 2015-05-14 DIAGNOSIS — G4730 Sleep apnea, unspecified: Secondary | ICD-10-CM | POA: Diagnosis present

## 2015-05-14 DIAGNOSIS — Z7982 Long term (current) use of aspirin: Secondary | ICD-10-CM

## 2015-05-14 DIAGNOSIS — R109 Unspecified abdominal pain: Secondary | ICD-10-CM | POA: Diagnosis present

## 2015-05-14 LAB — GLUCOSE, CAPILLARY
GLUCOSE-CAPILLARY: 141 mg/dL — AB (ref 65–99)
GLUCOSE-CAPILLARY: 215 mg/dL — AB (ref 65–99)

## 2015-05-14 LAB — AMMONIA: Ammonia: 31 umol/L (ref 9–35)

## 2015-05-14 LAB — PLATELET COUNT: PLATELETS: 256 10*3/uL (ref 150–440)

## 2015-05-14 MED ORDER — SUCRALFATE 1 G PO TABS
1.0000 g | ORAL_TABLET | Freq: Three times a day (TID) | ORAL | Status: DC
  Administered 2015-05-14 – 2015-05-16 (×7): 1 g via ORAL
  Filled 2015-05-14 (×7): qty 1

## 2015-05-14 MED ORDER — HYDROMORPHONE HCL 1 MG/ML IJ SOLN
2.0000 mg | INTRAMUSCULAR | Status: DC | PRN
  Administered 2015-05-14 (×2): 2 mg via INTRAVENOUS
  Filled 2015-05-14 (×2): qty 2

## 2015-05-14 MED ORDER — PANTOPRAZOLE SODIUM 40 MG IV SOLR
40.0000 mg | Freq: Two times a day (BID) | INTRAVENOUS | Status: DC
  Administered 2015-05-14 – 2015-05-16 (×5): 40 mg via INTRAVENOUS
  Filled 2015-05-14 (×6): qty 40

## 2015-05-14 MED ORDER — ACETAMINOPHEN 325 MG PO TABS: 650.0000 mg | ORAL_TABLET | Freq: Four times a day (QID) | ORAL | Status: DC | PRN

## 2015-05-14 MED ORDER — METFORMIN HCL 500 MG PO TABS: 500.0000 mg | ORAL_TABLET | Freq: Two times a day (BID) | ORAL | Status: DC

## 2015-05-14 MED ORDER — INFLUENZA VAC SPLIT QUAD 0.5 ML IM SUSY: 0.5000 mL | PREFILLED_SYRINGE | INTRAMUSCULAR | Status: DC

## 2015-05-14 MED ORDER — ACETAMINOPHEN 650 MG RE SUPP: 650.0000 mg | Freq: Four times a day (QID) | RECTAL | Status: DC | PRN

## 2015-05-14 MED ORDER — INSULIN ASPART 100 UNIT/ML ~~LOC~~ SOLN
0.0000 [IU] | Freq: Three times a day (TID) | SUBCUTANEOUS | Status: DC
  Administered 2015-05-14: 2 [IU] via SUBCUTANEOUS
  Administered 2015-05-16: 12:00:00 3 [IU] via SUBCUTANEOUS
  Administered 2015-05-16: 17:00:00 2 [IU] via SUBCUTANEOUS
  Administered 2015-05-17: 5 [IU] via SUBCUTANEOUS
  Filled 2015-05-14: qty 2
  Filled 2015-05-14: qty 5
  Filled 2015-05-14: qty 3
  Filled 2015-05-14: qty 2

## 2015-05-14 MED ORDER — PROMETHAZINE HCL 25 MG/ML IJ SOLN
12.5000 mg | Freq: Four times a day (QID) | INTRAMUSCULAR | Status: DC | PRN
  Administered 2015-05-14 – 2015-05-15 (×2): 12.5 mg via INTRAVENOUS
  Filled 2015-05-14 (×2): qty 1

## 2015-05-14 MED ORDER — ONDANSETRON HCL 4 MG/2ML IJ SOLN
4.0000 mg | Freq: Four times a day (QID) | INTRAMUSCULAR | Status: DC | PRN
  Administered 2015-05-14: 4 mg via INTRAVENOUS
  Filled 2015-05-14: qty 2

## 2015-05-14 MED ORDER — INSULIN ASPART 100 UNIT/ML ~~LOC~~ SOLN
0.0000 [IU] | Freq: Every day | SUBCUTANEOUS | Status: DC
  Administered 2015-05-14: 2 [IU] via SUBCUTANEOUS
  Filled 2015-05-14: qty 2

## 2015-05-14 MED ORDER — HEPARIN SODIUM (PORCINE) 5000 UNIT/ML IJ SOLN
5000.0000 [IU] | Freq: Three times a day (TID) | INTRAMUSCULAR | Status: DC
  Administered 2015-05-14 – 2015-05-17 (×8): 5000 [IU] via SUBCUTANEOUS
  Filled 2015-05-14 (×8): qty 1

## 2015-05-14 MED ORDER — SODIUM CHLORIDE 0.9 % IV SOLN
INTRAVENOUS | Status: DC
  Administered 2015-05-14 – 2015-05-15 (×3): via INTRAVENOUS

## 2015-05-14 MED ORDER — TRAMADOL HCL 50 MG PO TABS: 50.0000 mg | ORAL_TABLET | Freq: Four times a day (QID) | ORAL | Status: DC | PRN

## 2015-05-14 MED ORDER — GADOBENATE DIMEGLUMINE 529 MG/ML IV SOLN
20.0000 mL | Freq: Once | INTRAVENOUS | Status: AC | PRN
  Administered 2015-05-14: 18:00:00 20 mL via INTRAVENOUS

## 2015-05-14 MED ORDER — HYDROMORPHONE HCL 1 MG/ML IJ SOLN
1.0000 mg | INTRAMUSCULAR | Status: DC | PRN
  Administered 2015-05-14: 1 mg via INTRAVENOUS
  Filled 2015-05-14: qty 1

## 2015-05-14 MED ORDER — DOCUSATE SODIUM 100 MG PO CAPS
100.0000 mg | ORAL_CAPSULE | Freq: Two times a day (BID) | ORAL | Status: DC
  Administered 2015-05-14 – 2015-05-16 (×5): 100 mg via ORAL
  Filled 2015-05-14 (×6): qty 1

## 2015-05-14 MED ORDER — ONDANSETRON HCL 4 MG PO TABS
4.0000 mg | ORAL_TABLET | Freq: Four times a day (QID) | ORAL | Status: DC | PRN
  Administered 2015-05-14: 15:00:00 4 mg via ORAL
  Filled 2015-05-14: qty 1

## 2015-05-14 MED ORDER — METHOCARBAMOL 500 MG PO TABS: 750.0000 mg | ORAL_TABLET | Freq: Every evening | ORAL | Status: DC | PRN

## 2015-05-14 MED ORDER — PNEUMOCOCCAL VAC POLYVALENT 25 MCG/0.5ML IJ INJ: 0.5000 mL | INJECTION | INTRAMUSCULAR | Status: DC

## 2015-05-14 MED ORDER — HYDROCODONE-ACETAMINOPHEN 5-325 MG PO TABS: 1.0000 | ORAL_TABLET | ORAL | Status: DC | PRN

## 2015-05-14 MED ORDER — POLYETHYLENE GLYCOL 3350 17 G PO PACK: 17.0000 g | PACK | Freq: Every day | ORAL | Status: DC | PRN

## 2015-05-14 NOTE — Plan of Care (Signed)
Problem: Activity: Goal: Risk for activity intolerance will decrease Outcome: Progressing Pt is alert and oriented, direct admit with n/v and abdominal pain, vital signs stable, on room air, nausea treated with zofran and phenergan, abdominal pain , minimal duration of relief with 1 mg, dilaudid increased to 2 mg. Wife at bedside, emesis x 1, MRCP performed throughout shift, insulin coverage for diabetes, up in room independently, iv fluids infusing, uneventful shift.

## 2015-05-14 NOTE — Consult Note (Signed)
Reason for Consult: Abdominal Pain Referring Physician: Dr. Salomon Fick Ross [name] is an 54 y.o. male.  HPI: Ross [name] has been admitted for evaluation of intermittent right upper quadrant, epigastric and mid-abdominal pain. He was seen at his PCP's office today with worsening abdominal pain, nausea and decrease oral intake since Wednesday. He also noted that his urine had been pale green but dark since Thursday and his wife noted that his sclera was yellow. His liver function were found to be elevated with a Total Bilirubin of 7.4. Amylase and Lipase normal. IV hydration, Protonix, antiemetics and analgesic has relieved some of his nausea and abdominal pain.   Past Medical History  Diagnosis Date  . Diabetes mellitus without complication ([place])   . Hypertension   . GERD (gastroesophageal reflux disease)   . Hyperlipidemia   . Sleep apnea   . PTSD (post-traumatic stress disorder)     No past surgical history on file.  No family history on file.  Social History:  reports that he has never smoked. He does not have any smokeless tobacco history on file. He reports that he does not drink alcohol or use illicit drugs.  Allergies:  Allergies  Allergen Reactions  . Morphine And Related Nausea And Vomiting  . Penicillins Other (See Comments)    Unknown reaction - happened as a child  . Sulfa Antibiotics Other (See Comments)    Unknown reaction - happened when was a child    Medications:  I have reviewed the patient's current medications. Prior to Admission:  Prescriptions prior to admission  Medication Sig Dispense Refill Last Dose  . aspirin EC 81 MG tablet Take 81 mg by mouth daily.   Past Week at Unknown time  . atorvastatin (LIPITOR) 20 MG tablet Take 20 mg by mouth at bedtime.   Past Week at Unknown time  . fenofibrate (TRICOR) 145 MG tablet Take 145 mg by mouth daily.   Past Week at Unknown time  . glipiZIDE (GLUCOTROL) 10 MG tablet Take 10 mg by mouth 2 (two) times daily.    05/13/2015 at Unknown time  . HYDROmorphone (DILAUDID) 2 MG tablet Take 1 tablet (2 mg total) by mouth every 6 (six) hours as needed for severe pain. 12 tablet 0 Past Week at Unknown time  . losartan-hydrochlorothiazide (HYZAAR) 100-25 MG tablet Take 1 tablet by mouth daily.   Past Week at Unknown time  . ondansetron (ZOFRAN ODT) 4 MG disintegrating tablet Take 1 tablet (4 mg total) by mouth every 8 (eight) hours as needed for nausea or vomiting. 20 tablet 0 05/14/2015 at Unknown time  . senna (SENOKOT) 8.6 MG tablet Take 2 tablets by mouth every morning.   Past Week at Unknown time  . INVOKANA 300 MG TABS tablet Take 300 mg by mouth daily.   Not Taking at Unknown time  . metFORMIN (GLUCOPHAGE) 500 MG tablet Take 500 mg by mouth 2 (two) times daily with a meal.     . methocarbamol (ROBAXIN) 750 MG tablet Take 750 mg by mouth at bedtime as needed for muscle spasms.     Marland Kitchen omeprazole (PRILOSEC) 40 MG capsule Take 40 mg by mouth daily.     . ranitidine (ZANTAC) 150 MG tablet Take 150 mg by mouth daily.     . sucralfate (CARAFATE) 1 G tablet Take 1 g by mouth 4 (four) times daily -  with meals and at bedtime.     . traMADol (ULTRAM) 50 MG tablet Take by mouth  every 6 (six) hours as needed.      Scheduled: . docusate sodium  100 mg Oral BID  . heparin  5,000 Units Subcutaneous 3 times per day  . [START ON 05/15/2015] Influenza vac split quadrivalent PF  0.5 mL Intramuscular Tomorrow-1000  . insulin aspart  0-15 Units Subcutaneous TID WC  . insulin aspart  0-5 Units Subcutaneous QHS  . [START ON 05/15/2015] pneumococcal 23 valent vaccine  0.5 mL Intramuscular Tomorrow-1000   Continuous: . sodium chloride 75 mL/hr at 05/14/15 1527   [id number] **OR** acetaminophen, HYDROcodone-acetaminophen, HYDROmorphone (DILAUDID) injection, ondansetron **OR** ondansetron (ZOFRAN) IV, polyethylene glycol Anti-infectives    None      Results for orders placed or performed during the hospital encounter  of 05/14/15 (from the past 48 hour(s))  Ammonia     Status: None   Collection Time: 05/14/15  2:35 PM  Result Value Ref Range   Ammonia 31 9 - 35 umol/L  Platelet count     Status: None   Collection Time: 05/14/15  2:35 PM  Result Value Ref Range   Platelets 256 150 - 440 K/uL   05/14/15  CMP normal except Sodium of 132. Glucose 213. Total Bilirubin 7.4, Alkaline Phosphatase 214, AST 292 and ALT 491. Amylse 21, Lipase 10.CBC normal.   04/25/15 CT CLINICAL DATA:Epigastric abdominal pain, burning and nausea. History of pancreatitis in 2012.  EXAM: CT ABDOMEN AND PELVIS WITH CONTRAST  TECHNIQUE: Multidetector CT imaging of the abdomen and pelvis was performed using the standard protocol following bolus administration of intravenous contrast.  CONTRAST:119mL OMNIPAQUE IOHEXOL 300 MG/MLSOLN  COMPARISON:Abdomen ultrasound dated 04/25/2015.  FINDINGS: Diffuse low density of the liver relative to the spleen. The previously seen tiny left lobe liver cyst is not visualized. There is some calcific density within the bowel. Normal appearing appendix. Small to moderate-sized umbilical hernia containing fat.  Unremarkable spleen, pancreas, gallbladder, right adrenal gland, urinary bladder and prostate gland. Small bilateral inguinal hernias containing fat. 7 mm rounded mass containing fat density in the upper pole of the left kidney on image number 46. Small right renal cyst. 2.0 x 1.1 cm arm oval left adrenal mass on image number 37.  Clear lung bases. Mild lumbar and lower thoracic spine degenerative changes. Bilateral L5 pars interarticularis defects with 10 mm of anterolisthesis at the L5-S1 level.  IMPRESSION: 1. No acute abnormality. 2. Diffuse hepatic steatosis. 3. Small to moderate-sized umbilical hernia containing fat. 4. Small bilateral inguinal hernias containing fat. 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 6. 2.0 cm left adrenal probable adenoma. 7. Bilateral  L5 spondylolysis with associated grade 1-2 spondylolisthesis at the L5-S1 level.   Electronically Signed ByCorena [name] M.D. On: 04/26/2015 10:02  04/26/15 [place] CLINICAL DATA:Acute onset of epigastric abdominal pain. Initial encounter.  EXAM: US ABDOMEN LIMITED - RIGHT UPPER QUADRANT  COMPARISON:Abdominal ultrasound performed 02/08/2011  FINDINGS: Gallbladder:  No gallstones or wall thickening visualized. Gallbladder sludge cannot be excluded, as evaluation is somewhat limited due to artifact from the patient's habitus. No sonographic Murphy sign noted.  Common bile duct:  Diameter: 0.3 cm, within normal limits in caliber.  Liver:  A small 0.9 cm cyst is noted along the anterior edge of the left hepatic lobe. No additional focal lesion seen. Diffusely increased parenchymal echogenicity and coarsened echotexture, compatible with fatty infiltration.  IMPRESSION: 1. No acute abnormality seen at the right upper quadrant. 2. Diffuse fatty infiltration within the liver. 3. Tiny hepatic cyst noted.   Electronically Signed By: Christella Scheuermann.D.  On: 04/25/2015 06:21  05/07/15 US/Fibroscan: Metavir Fibrosis score is some F3+F4. Risk of Fibrosis is high.   Review of Systems  Constitutional: Positive for weight loss and malaise/fatigue.  HENT: Negative.   Eyes: Negative.   Respiratory: Negative.   Cardiovascular: Negative.   Gastrointestinal: Positive for nausea, vomiting and abdominal pain.       Dark urine.  Genitourinary: Negative.        Dark urine.   Musculoskeletal: Negative.   Skin:       Jaundice  Neurological: Negative.   Endo/Heme/Allergies: Negative.   Psychiatric/Behavioral: Negative.    Blood pressure 115/65, pulse 79, temperature 98 F (36.7 C), temperature source Oral, resp. rate 20, height 6\' 2"  (1.88 m), weight 122.925 kg (271 lb), SpO2 95 %. Physical Exam  Nursing note reviewed. Constitutional: He is oriented to person, place,  and time. He appears well-developed. No distress.  Morbidly obese.   HENT:  Head: Normocephalic and atraumatic.  Nose: Nose normal.  Mouth/Throat: Oropharynx is clear and moist. No oropharyngeal exudate.  Eyes: EOM are normal. Scleral icterus is present.  Neck: Normal range of motion. Neck supple. No JVD present. No tracheal deviation present. No thyromegaly present.  Cardiovascular: Normal rate, regular rhythm and normal heart sounds.   Respiratory: Effort normal and breath sounds normal. No stridor.  GI: Soft. Bowel sounds are normal. He exhibits distension. He exhibits no mass. There is tenderness. There is no rebound and no guarding.  Right upper, Epigastric and mid abdominal pain.  Genitourinary:  Deferred.   Musculoskeletal: He exhibits no edema or tenderness.  Lymphadenopathy:    He has no cervical adenopathy.  Neurological: He is alert and oriented to person, place, and time.  Skin: Skin is warm and dry. He is not diaphoretic.  Jaundice.  Psychiatric: He has a normal mood and affect. His behavior is normal. Judgment and thought content normal.    Assessment/Plan: Elevated Liver Functions, Right Upper Quadrant Abdominal Pain, Epigastric and Mid Abdominal Pain.  05/07/15 [place] negative for Gallstones or sludge. MRCP ordered to evaluate for Biliary disease or Choledocholithiasis. He may need an ERCP. EGD and Colonoscopy to be cancelled. Agree with Protonix and IV hydration.   Patient seen with Dr. Candace Cruise within a Collaborative Agreement.  Faye Ramsay, FNP-BC 05/14/2015, 3:37 PM

## 2015-05-14 NOTE — Consult Note (Signed)
  Pt seen and examined. Please see D. Martin's notes. Pt was originally scheduled for EGD/colonoscopy on Monday. However, in light of increasing abdominal pain and jaundice, will have to cancel these procedures. Instead, pt may end up needing ERCP if MRCP that we ordered for today show CBD stones. Will follow. Thanks.

## 2015-05-14 NOTE — H&P (Signed)
[place] [facility] [place] NAME: Ross [name]    MR#:  [id number]  DATE OF BIRTH:  Nov 12, 1960  DATE OF ADMISSION:  05/14/2015  PRIMARY CARE PHYSICIAN: Perrin Maltese, MD   REQUESTING/REFERRING PHYSICIAN: Dr. Karlyn Agee  CHIEF COMPLAINT:  No chief complaint on file.   HISTORY OF PRESENT ILLNESS:  Ross [name]  is a 54 y.o. male with a known history of hypertension, diabetes mellitus, gastroesophageal reflux disease comes to the hospital from PCP office secondary to persistent right upper quadrant and also epigastric pain. Patient's symptoms started about 3 weeks ago with abdominal pain radiating to the back, he had 2 ER visits to [facility] and had CT of the abdomen done at that time and was discharged home for treatment of gastritis. Gallbladder and pancreas looked okay on the CT. Patient did okay for a couple of days and then had to go to [facility] emergency room for similar complaints. He was seen by GI as an outpatient about a week ago. Scheduled to have EGD and colonoscopy in 3 days. His symptoms got worse, he was noticed to have Aida Puffer and went to visit his PCP today. Says his urine is*color, stools are pale. Denies any hematemesis or melena. He complains of ongoing weakness and worsening epigastric and right upper quadrant pain. Labs done at the PCPs office indicated that his LFTs, AST and ALT, alkaline phosphatase and bilirubin are elevated. So she sent for direct admission. Bili is as high as 7.4 today. His last bilirubin 3 weeks ago was completely normal.  PAST MEDICAL HISTORY:   Past Medical History  Diagnosis Date  . Diabetes mellitus without complication ([place])   . Hypertension   . GERD (gastroesophageal reflux disease)   . Hyperlipidemia   . Sleep apnea   . PTSD (post-traumatic stress disorder)     PAST SURGICAL HISTORY:   Past Surgical History  Procedure Laterality Date  . Tonsillectomy    . Uvelectomy    . Adenoidectomy    .  Left shoulder arthroscopic repair      For adhesion capsulitis    SOCIAL HISTORY:   Social History  Substance Use Topics  . Smoking status: Never Smoker   . Smokeless tobacco: Not on file  . Alcohol Use: No    FAMILY HISTORY:   Family History  Problem Relation Age of Onset  . Dementia Father   . Hypertension Mother   . Diabetes Mellitus II Mother     DRUG ALLERGIES:   Allergies  Allergen Reactions  . Morphine And Related Nausea And Vomiting  . Penicillins Other (See Comments)    Unknown reaction - happened as a child  . Sulfa Antibiotics Other (See Comments)    Unknown reaction - happened when was a child    REVIEW OF SYSTEMS:   Review of Systems  Constitutional: Positive for malaise/fatigue. Negative for fever, chills and weight loss.  HENT: Negative for ear discharge, ear pain, nosebleeds and tinnitus.   Eyes: Negative for blurred vision, double vision and photophobia.  Respiratory: Negative for cough, hemoptysis, shortness of breath and wheezing.   Cardiovascular: Negative for chest pain, palpitations, orthopnea and leg swelling.  Gastrointestinal: Positive for nausea and abdominal pain. Negative for heartburn, vomiting, diarrhea, constipation and melena.       Feels bloated  Genitourinary: Negative for dysuria, urgency, frequency and hematuria.  Musculoskeletal: Negative for myalgias, back pain and neck pain.  Skin: Negative for rash.  Neurological: Negative for  dizziness, tingling, tremors, sensory change, speech change, focal weakness and headaches.  Endo/Heme/Allergies: Does not bruise/bleed easily.  Psychiatric/Behavioral: Negative for depression.    MEDICATIONS AT HOME:   Prior to Admission medications   Medication Sig Start Date End Date Taking? Authorizing Provider  aspirin EC 81 MG tablet Take 81 mg by mouth daily.   Yes Historical Provider, MD  atorvastatin (LIPITOR) 20 MG tablet Take 20 mg by mouth at bedtime.   Yes Historical Provider, MD   fenofibrate (TRICOR) 145 MG tablet Take 145 mg by mouth daily.   Yes Historical Provider, MD  glipiZIDE (GLUCOTROL) 10 MG tablet Take 10 mg by mouth 2 (two) times daily.   Yes Historical Provider, MD  HYDROmorphone (DILAUDID) 2 MG tablet Take 1 tablet (2 mg total) by mouth every 6 (six) hours as needed for severe pain. 04/26/15 04/25/16 Yes Anne-Caroline Mariea Clonts, MD  losartan-hydrochlorothiazide (HYZAAR) 100-25 MG tablet Take 1 tablet by mouth daily.   Yes Historical Provider, MD  ondansetron (ZOFRAN ODT) 4 MG disintegrating tablet Take 1 tablet (4 mg total) by mouth every 8 (eight) hours as needed for nausea or vomiting. 04/26/15  Yes Anne-Caroline Mariea Clonts, MD  senna (SENOKOT) 8.6 MG tablet Take 2 tablets by mouth every morning.   Yes Historical Provider, MD  INVOKANA 300 MG TABS tablet Take 300 mg by mouth daily.    Historical Provider, MD  metFORMIN (GLUCOPHAGE) 500 MG tablet Take 500 mg by mouth 2 (two) times daily with a meal.    Historical Provider, MD  methocarbamol (ROBAXIN) 750 MG tablet Take 750 mg by mouth at bedtime as needed for muscle spasms.    Historical Provider, MD  omeprazole (PRILOSEC) 40 MG capsule Take 40 mg by mouth daily.    Historical Provider, MD  ranitidine (ZANTAC) 150 MG tablet Take 150 mg by mouth daily.    Historical Provider, MD  sucralfate (CARAFATE) 1 G tablet Take 1 g by mouth 4 (four) times daily -  with meals and at bedtime.    Historical Provider, MD  traMADol (ULTRAM) 50 MG tablet Take by mouth every 6 (six) hours as needed.    Historical Provider, MD      VITAL SIGNS:  Blood pressure 127/79, pulse 80, temperature 98.6 F (37 C), temperature source Oral, resp. rate 20, height 6\' 2"  (1.88 m), weight 122.925 kg (271 lb), SpO2 91 %.  PHYSICAL EXAMINATION:   Physical Exam  GENERAL:  54 y.o.-year-old obese patient lying in the bed with no acute distress.  EYES: Pupils equal, round, reactive to light and accommodation. No scleral icterus. Extraocular muscles  intact.  HEENT: Head atraumatic, normocephalic. Oropharynx and nasopharynx clear.  NECK:  Supple, no jugular venous distention. No thyroid enlargement, no tenderness.  LUNGS: Normal breath sounds bilaterally, no wheezing, rales,rhonchi or crepitation. No use of accessory muscles of respiration.  CARDIOVASCULAR: S1, S2 normal. No murmurs, rubs, or gallops.  ABDOMEN: Soft, discomfort in right upper quadrant and also tenderness in the epigastric area. No guarding or rigidity. Nondistended abdomen.. Bowel sounds present. No organomegaly or mass.  EXTREMITIES: No pedal edema, cyanosis, or clubbing.  NEUROLOGIC: Cranial nerves II through XII are intact. Muscle strength 5/5 in all extremities. Sensation intact. Gait not checked.  PSYCHIATRIC: The patient is alert and oriented x 3.  SKIN: No obvious rash, lesion, or ulcer.   LABORATORY PANEL:   CBC  Recent Labs Lab 05/14/15 1435  PLT 256   ------------------------------------------------------------------------------------------------------------------  Chemistries  No results for input(s): NA, K, CL,  CO2, GLUCOSE, BUN, CREATININE, CALCIUM, MG, AST, ALT, ALKPHOS, BILITOT in the last 168 hours.  Invalid input(s): GFRCGP ------------------------------------------------------------------------------------------------------------------  Cardiac Enzymes No results for input(s): TROPONINI in the last 168 hours. ------------------------------------------------------------------------------------------------------------------  RADIOLOGY:  No results found.  EKG:   Orders placed or performed during the hospital encounter of 04/26/15  . ED EKG  . ED EKG    IMPRESSION AND PLAN:   Ross [name]  is a 54 y.o. male with a known history of hypertension, diabetes mellitus, gastroesophageal reflux disease comes to the hospital from PCP office secondary to persistent right upper quadrant and also epigastric pain.  #1 abdominal pain with elevated  LFTs- right upper quadrant pain, ultrasound done about a week ago with no gallstones. -However LFTs are significantly elevated with elevated bilirubin for now. -GI consultation. MRCP today. If needed ERCP will be done if any stone or obstruction noted. -Acute hepatitis panel ordered. -Also possible gastritis with epigastric pain. Will need EGD.  #2 hepatic steatosis-fiber scan done as an outpatient. Following up with GI. 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 -Hold atorvastatin and fenofibrate  #3 diabetes mellitus- Invokana now has been stopped as an outpatient due to possible side effect of pancreatitis -And tinea metformin, start sliding scale insulin. Patient is only on clears now. -If sugars are elevated will start Lantus  #4 hypertension-losartan, hydrochlorothiazide are on hold while labs are pending  #5 gastroesophageal reflux disease-IV Protonix for now. -Carafate. Will need EGD at some point  #6 DVT prophylaxis-on subcutaneous heparin  All the records are reviewed and case discussed with ED provider. Management plans discussed with the patient, family and they are in agreement.  CODE STATUS: Full Code  TOTAL TIME TAKING CARE OF THIS PATIENT: 50 minutes.    Gladstone Lighter M.D on 05/14/2015 at 5:48 PM  Between 7am to 6pm - Pager - [phone]  After 6pm go to [email or web address] - password [email or web address]  Office  [phone]  CC: Primary care physician; Perrin Maltese, MD

## 2015-05-15 ENCOUNTER — Encounter: Payer: Self-pay | Admitting: Gastroenterology

## 2015-05-15 LAB — HEPATITIS PANEL, ACUTE
HCV Ab: <0.1 s/co ratio (ref 0.0–0.9)
HEP B C IGM: NEGATIVE
Hep A IgM: NEGATIVE
Hepatitis B Surface Ag: NEGATIVE

## 2015-05-15 LAB — COMPREHENSIVE METABOLIC PANEL
ALT: 444 U/L — ABNORMAL HIGH (ref 17–63)
AST: 213 U/L — AB (ref 15–41)
Albumin: 3.4 g/dL — ABNORMAL LOW (ref 3.5–5.0)
Alkaline Phosphatase: 196 U/L — ABNORMAL HIGH (ref 38–126)
Anion gap: 4 — ABNORMAL LOW (ref 5–15)
BILIRUBIN TOTAL: 3.9 mg/dL — AB (ref 0.3–1.2)
BUN: 15 mg/dL (ref 6–20)
CHLORIDE: 105 mmol/L (ref 101–111)
CO2: 30 mmol/L (ref 22–32)
Calcium: 9 mg/dL (ref 8.9–10.3)
Creatinine, Ser: 0.96 mg/dL (ref 0.61–1.24)
GFR calc Af Amer: >60 mL/min (ref >60)
GFR calc non Af Amer: >60 mL/min (ref >60)
GLUCOSE: 135 mg/dL — AB (ref 65–99)
POTASSIUM: 4 mmol/L (ref 3.5–5.1)
Sodium: 139 mmol/L (ref 135–145)
TOTAL PROTEIN: 6.7 g/dL (ref 6.5–8.1)

## 2015-05-15 LAB — PROTIME-INR
INR: 1.13
PROTHROMBIN TIME: 14.7 s (ref 11.4–15.0)

## 2015-05-15 LAB — GLUCOSE, CAPILLARY
GLUCOSE-CAPILLARY: 117 mg/dL — AB (ref 65–99)
GLUCOSE-CAPILLARY: 101 mg/dL — AB (ref 65–99)
GLUCOSE-CAPILLARY: 92 mg/dL (ref 65–99)
Glucose-Capillary: 86 mg/dL (ref 65–99)

## 2015-05-15 LAB — CBC
HEMATOCRIT: 44.5 % (ref 40.0–52.0)
Hemoglobin: 15.5 g/dL (ref 13.0–18.0)
MCH: 31 pg (ref 26.0–34.0)
MCHC: 34.8 g/dL (ref 32.0–36.0)
MCV: 89.1 fL (ref 80.0–100.0)
Platelets: 229 10*3/uL (ref 150–440)
RBC: 5 MIL/uL (ref 4.40–5.90)
RDW: 12.6 % (ref 11.5–14.5)
WBC: 6.2 10*3/uL (ref 3.8–10.6)

## 2015-05-15 LAB — LIPASE, BLOOD: Lipase: 949 U/L — ABNORMAL HIGH (ref 11–51)

## 2015-05-15 LAB — HEMOGLOBIN A1C: Hgb A1c MFr Bld: 8.1 % — ABNORMAL HIGH (ref 4.0–6.0)

## 2015-05-15 LAB — TRIGLYCERIDES: TRIGLYCERIDES: 205 mg/dL — AB (ref <150)

## 2015-05-15 MED ORDER — PRAVASTATIN SODIUM 20 MG PO TABS
20.0000 mg | ORAL_TABLET | Freq: Every day | ORAL | Status: DC
  Administered 2015-05-15: 17:00:00 20 mg via ORAL
  Filled 2015-05-15: qty 1

## 2015-05-15 MED ORDER — DIPHENHYDRAMINE HCL 25 MG PO CAPS
25.0000 mg | ORAL_CAPSULE | Freq: Four times a day (QID) | ORAL | Status: DC | PRN
Start: 2015-05-15 — End: 2015-05-17
  Administered 2015-05-15: 25 mg via ORAL
  Filled 2015-05-15: qty 1

## 2015-05-15 MED ORDER — DEXTROSE-NACL 5-0.9 % IV SOLN
INTRAVENOUS | Status: DC
Start: 2015-05-15 — End: 2015-05-17
  Administered 2015-05-15 – 2015-05-16 (×4): via INTRAVENOUS

## 2015-05-15 NOTE — Progress Notes (Addendum)
Initial Nutrition Assessment   INTERVENTION:   Coordination of Care: await diet progression as medically able   NUTRITION DIAGNOSIS:   Inadequate oral intake related to inability to eat as evidenced by NPO status.  GOAL:   Patient will meet greater than or equal to 90% of their needs  MONITOR:    (Energy Intake, Hepatic Profile, Anthropometrics, Digestive system)  REASON FOR ASSESSMENT:   Malnutrition Screening Tool    ASSESSMENT:   Pt admitted with abdominal pain with n/v started intitially 3 weeks ago. Per MD note, pt with gastritis, nonalcoholic hepatic steatohepatitis, with jaundice, likely plan for MRCP and possibly ERCP.   Past Medical History  Diagnosis Date  . Diabetes mellitus without complication ([place])   . Hypertension   . GERD (gastroesophageal reflux disease)   . Hyperlipidemia   . Sleep apnea   . PTSD (post-traumatic stress disorder)      Diet Order:  Diet NPO time specified Except for: Ice Chips, Sips with Meds    Current Nutrition: Pt reports last eating jello last night but vomitted afterwards, pt also reports having just had dilaudid prior to eating jello. Pt NPO this am.   Food/Nutrition-Related History: Pt reports last eating a meal a day and half ago. Pt reports after first 2 'incidents' (recent admissions) pt appetite was doing good, as usual. Pt reports after admission to [facility] on 05/05/2015 pt has cut his eating in half and has been intentionally eating 50% of all meals.    Scheduled Medications:  . docusate sodium  100 mg Oral BID  . heparin  5,000 Units Subcutaneous 3 times per day  . Influenza vac split quadrivalent PF  0.5 mL Intramuscular Tomorrow-1000  . insulin aspart  0-15 Units Subcutaneous TID WC  . insulin aspart  0-5 Units Subcutaneous QHS  . metFORMIN  500 mg Oral BID WC  . pantoprazole (PROTONIX) IV  40 mg Intravenous Q12H  . pneumococcal 23 valent vaccine  0.5 mL Intramuscular Tomorrow-1000  . sucralfate  1 g Oral TID WC & HS     Continuous Medications:  . sodium chloride 100 mL/hr at 05/15/15 0356     Electrolyte/Renal Profile and Glucose Profile:   Recent Labs Lab 05/15/15 0441  NA 139  K 4.0  CL 105  CO2 30  BUN 15  CREATININE 0.96  CALCIUM 9.0  GLUCOSE 135*   Protein Profile:   Recent Labs Lab 05/15/15 0441  ALBUMIN 3.4*   Hepatic Function Latest Ref Rng 05/15/2015 04/26/2015 04/25/2015  Total Protein 6.5 - 8.1 g/dL 6.7 7.5 6.8  Albumin 3.5 - 5.0 g/dL 3.4(L) 4.3 4.1  AST 15 - 41 U/L 213(H) 38 33  ALT 17 - 63 U/L 444(H) 60 49  Alk Phosphatase 38 - 126 U/L 196(H) 91 79  Total Bilirubin 0.3 - 1.2 mg/dL 3.9(H) 0.8 0.8    Lipase     Component Value Date/Time   LIPASE 949* 05/15/2015 0441     Gastrointestinal Profile: Last BM:  05/13/2015    Nutrition-Focused Physical Exam Findings: Nutrition-Focused physical exam completed. Findings are WDL for fat depletion, muscle depletion, and edema.    Weight Change: Pt reports 25lbs weight loss in the past 3 weeks. Pt reports last weight of 307lbs on  04/26/2015. (12% weight loss in 3 weeks).    Skin:  Reviewed, no issues   Height:   Ht Readings from Last 1 Encounters:  05/14/15 6' 2"  (1.88 m)    Weight:   Wt Readings from Last  1 Encounters:  05/14/15 271 lb (122.925 kg)    Wt Readings from Last 10 Encounters:  05/14/15 271 lb (122.925 kg)  05/14/15 271 lb (122.925 kg)  04/26/15 210 lb (95.255 kg)  04/25/15 310 lb (140.615 kg)     Ideal Body Weight:   86kg  BMI:  Body mass index is 34.78 kg/(m^2).  Estimated Nutritional Needs:   Kcal:  using IBW of 86kg, BEE: 1769kcals, TEE: (IF 1.1-1.3)(AF 1.2) 2336-2760kcals  Protein:  69-95g protein (0.8-1.0g/kg)  Fluid:  2150-2551m o fluid (25-381mkg)  EDUCATION NEEDS:   No education needs identified at this time   MONixonRD, LDN Pager [phone]

## 2015-05-15 NOTE — Plan of Care (Signed)
Problem: Safety: Goal: Ability to remain free from injury will improve Outcome: Progressing Low fall precautions maintained. Pt remained free of injury.  Problem: Pain Managment: Goal: General experience of comfort will improve Outcome: Progressing Mild epigastric pain, pt declined an intervention.  Problem: Tissue Perfusion: Goal: Risk factors for ineffective tissue perfusion will decrease Outcome: Progressing Radial and pedal pulses present.Pt ambulates without difficulty. Teds and SCD's on. Heparin continues.  Problem: Activity: Goal: Risk for activity intolerance will decrease Outcome: Progressing Pt ambulates independently without difficulty.  Problem: Nutrition: Goal: Adequate nutrition will be maintained Outcome: Progressing Still NPO. CBG's monitored. Metformin held. Phenergan given for nausea once with improvement.  Problem: Health Behavior: Goal: Ability to formulate a plan to maintain an alcohol-free life will improve Outcome: Not Applicable Date Met:  43/60/16 Pt verbalized that he does not drink alcohol and no H/O it as well.  Problem: Nutritional: Goal: Ability to achieve adequate nutritional intake will improve Outcome: Progressing Still NPO. CBG's monitored. Metformin held. Phenergan given for nausea once with improvement

## 2015-05-15 NOTE — Consult Note (Signed)
  GI Inpatient Follow-up Note  Patient Identification: Ross [name] is a 54 y.o. male with abdominal pain and LFT elevation.  Subjective: Feels better. Less abdominal pain. Does have acute pancreatitis confirmed on MRCP and lipase elevated. MRCP does not show dilated CBD or CBD stones. Scheduled Inpatient Medications:  . docusate sodium  100 mg Oral BID  . heparin  5,000 Units Subcutaneous 3 times per day  . Influenza vac split quadrivalent PF  0.5 mL Intramuscular Tomorrow-1000  . insulin aspart  0-15 Units Subcutaneous TID WC  . insulin aspart  0-5 Units Subcutaneous QHS  . metFORMIN  500 mg Oral BID WC  . pantoprazole (PROTONIX) IV  40 mg Intravenous Q12H  . pneumococcal 23 valent vaccine  0.5 mL Intramuscular Tomorrow-1000  . sucralfate  1 g Oral TID WC & HS    Continuous Inpatient Infusions:   . sodium chloride 100 mL/hr at 05/15/15 0356    PRN Inpatient Medications:  acetaminophen **OR** acetaminophen, diphenhydrAMINE, HYDROcodone-acetaminophen, HYDROmorphone (DILAUDID) injection, methocarbamol, ondansetron **OR** ondansetron (ZOFRAN) IV, polyethylene glycol, promethazine, traMADol  Review of Systems: Constitutional: Weight is stable.  Eyes: No changes in vision. ENT: No oral lesions, sore throat.  GI: see HPI.  Heme/Lymph: No easy bruising.  CV: No chest pain.  GU: No hematuria.  Integumentary: No rashes.  Neuro: No headaches.  Psych: No depression/anxiety.  Endocrine: No heat/cold intolerance.  Allergic/Immunologic: No urticaria.  Resp: No cough, SOB.  Musculoskeletal: No joint swelling.    Physical Examination: BP 124/76 mmHg  Pulse 62  Temp(Src) 98 F (36.7 C) (Oral)  Resp 18  Ht 6\' 2"  (1.88 m)  Wt 122.925 kg (271 lb)  BMI 34.78 kg/m2  SpO2 97% Gen: NAD, alert and oriented x 4 HEENT: PEERLA, EOMI, Neck: supple, no JVD or thyromegaly Chest: CTA bilaterally, no wheezes, crackles, or other adventitious sounds CV: RRR, no m/g/c/r Abd: soft, mild  abdominal tenderness, ND, +BS in all four quadrants; no HSM, guarding, ridigity, or rebound tenderness Ext: no edema, well perfused with 2+ pulses, Skin: no rash or lesions noted Lymph: no LAD  Data: Lab Results  Component Value Date   WBC 6.2 05/15/2015   HGB 15.5 05/15/2015   HCT 44.5 05/15/2015   MCV 89.1 05/15/2015   PLT 229 05/15/2015    Recent Labs Lab 05/15/15 0441  HGB 15.5   Lab Results  Component Value Date   NA 139 05/15/2015   K 4.0 05/15/2015   CL 105 05/15/2015   CO2 30 05/15/2015   BUN 15 05/15/2015   CREATININE 0.96 05/15/2015   Lab Results  Component Value Date   ALT 444* 05/15/2015   AST 213* 05/15/2015   ALKPHOS 196* 05/15/2015   BILITOT 3.9* 05/15/2015    Recent Labs Lab 05/15/15 0441  INR 1.13   Assessment/Plan: Mr. Cozad is a 54 y.o. male with pancreatitis. This pancreatitis could be gallstone-related or medication-induced.   Recommendations: Hold off on ERCP at this time due to normal appearing CBD on MRCP. Continue NPO, IVF, and pain/nausea meds. Continue to moniter LFT and lipase levels. Thanks. Please call with questions or concerns.  Nikkie Liming, Lupita Dawn, MD

## 2015-05-15 NOTE — Progress Notes (Signed)
[name] [place] NAME: Ross [name]    MR#:  [id number]  DATE OF BIRTH:  [birth year]  SUBJECTIVE:  CHIEF COMPLAINT:    REVIEW OF SYSTEMS:  CONSTITUTIONAL: No fever, fatigue or weakness.  EYES: No blurred or double vision.  EARS, NOSE, AND THROAT: No tinnitus or ear pain.  RESPIRATORY: No cough, shortness of breath, wheezing or hemoptysis.  CARDIOVASCULAR: No chest pain, orthopnea, edema.  GASTROINTESTINAL: No nausea, vomiting, diarrhea or abdominal pain.  GENITOURINARY: No dysuria, hematuria.  ENDOCRINE: No polyuria, nocturia,  HEMATOLOGY: No anemia, easy bruising or bleeding SKIN: No rash or lesion. MUSCULOSKELETAL: No joint pain or arthritis.   NEUROLOGIC: No tingling, numbness, weakness.  PSYCHIATRY: No anxiety or depression.   DRUG ALLERGIES:   Allergies  Allergen Reactions  . Morphine And Related Nausea And Vomiting  . Penicillins Other (See Comments)    Unknown reaction - happened as a child  . Sulfa Antibiotics Other (See Comments)    Unknown reaction - happened when was a child    VITALS:  Blood pressure 126/79, pulse 72, temperature 98.5 F (36.9 C), temperature source Oral, resp. rate 18, height 6\' 2"  (1.88 m), weight 122.925 kg (271 lb), SpO2 96 %.  PHYSICAL EXAMINATION:  GENERAL:  54 y.o.-year-old patient lying in the bed with no acute distress.  EYES: Pupils equal, round, reactive to light and accommodation. No scleral icterus. Extraocular muscles intact.  HEENT: Head atraumatic, normocephalic. Oropharynx and nasopharynx clear.  NECK:  Supple, no jugular venous distention. No thyroid enlargement, no tenderness.  LUNGS: Normal breath sounds bilaterally, no wheezing, rales,rhonchi or crepitation. No use of accessory muscles of respiration.  CARDIOVASCULAR: S1, S2 normal. No murmurs, rubs, or gallops.  ABDOMEN: Soft, nontender, nondistended. Bowel sounds present. No organomegaly or mass.  EXTREMITIES: No  pedal edema, cyanosis, or clubbing.  NEUROLOGIC: Cranial nerves II through XII are intact. Muscle strength 5/5 in all extremities. Sensation intact. Gait not checked.  PSYCHIATRIC: The patient is alert and oriented x 3.  SKIN: No obvious rash, lesion, or ulcer.    LABORATORY PANEL:   CBC  Recent Labs Lab 05/15/15 0441  WBC 6.2  HGB 15.5  HCT 44.5  PLT 229   ------------------------------------------------------------------------------------------------------------------  Chemistries   Recent Labs Lab 05/15/15 0441  NA 139  K 4.0  CL 105  CO2 30  GLUCOSE 135*  BUN 15  CREATININE 0.96  CALCIUM 9.0  AST 213*  ALT 444*  ALKPHOS 196*  BILITOT 3.9*   ------------------------------------------------------------------------------------------------------------------  Cardiac Enzymes No results for input(s): TROPONINI in the last 168 hours. ------------------------------------------------------------------------------------------------------------------  RADIOLOGY:  Mr Abd W/wo Cm/mrcp  05/14/2015  CLINICAL DATA:  Increasing jaundice and abdominal pain. Nausea and vomiting since April 26, 2015. EXAM: MRI ABDOMEN WITHOUT AND WITH CONTRAST (INCLUDING MRCP) TECHNIQUE: Multiplanar multisequence MR imaging of the abdomen was performed both before and after the administration of intravenous contrast. Heavily T2-weighted images of the biliary and pancreatic ducts were obtained, and three-dimensional MRCP images were rendered by post processing. CONTRAST:  88mL MULTIHANCE GADOBENATE DIMEGLUMINE 529 MG/ML IV SOLN COMPARISON:  Multiple exams, including 04/26/2015 FINDINGS: Despite efforts by the technologist and patient, motion artifact is present on today's exam and could not be eliminated. This reduces exam sensitivity and specificity. Lower chest: Linear scarring or subsegmental atelectasis in the right lower lobe. Hepatobiliary: Periportal edema. Diffuse hepatic steatosis.  Gallbladder wall thickening. The common hepatic duct measures 6 mm in diameter. The common bile duct measures  6 mm in diameter. No filling defect is identified. No beading or irregularity of the biliary tree. No abnormal wall thickening in the CBD or common hepatic duct. There is a small amount of edema surrounding the gallbladder. Several tiny hepatic cysts are present and there is sludge in the gallbladder. Pancreas: Pancreatic and faint peripancreatic edema suspicious for acute pancreatitis. No dorsal pancreatic duct dilatation. No pancreatic mass, abscess, necrosis, or pseudocyst. Spleen: Unremarkable Adrenals/Urinary Tract: Several tiny renal cysts are present, including a 1.2 cm cyst in the right kidney upper pole. This is likely a Bosniak category 2 cyst based on a faint internal septation. A mass of the medial limb left adrenal gland measures 1.0 by 1.8 cm and technically nonspecific for adenoma. Stomach/Bowel: Unremarkable Vascular/Lymphatic: No vascular abnormality. Small porta hepatis lymph nodes are likely reactive. Other: No supplemental non-categorized findings. Musculoskeletal: Unremarkable IMPRESSION: 1. Gallbladder wall thickening with a small amount of pericholecystic fluid and sludge in the gallbladder. No gallstones are identified. Acute cholecystitis is not excluded. 2. Borderline prominent extrahepatic biliary tree but no wall thickening or filling defect in the common bile duct. If clinically warranted, nuclear medicine hepatobiliary scan could be utilized to assess for hepatocellular dysfunction and also to assess for cholecystitis. There is generalized hepatic steatosis. 3. Abnormal edema throughout the pancreatic parenchyma and mild peripancreatic edema, compatible with acute pancreatitis. No pancreatic mass, abscess, pseudocyst, or necrosis identified. 4. Mild periportal edema, nonspecific. 5. Small mass of the medial limb left adrenal gland is nonspecific based on imaging characteristics.  Electronically Signed   By: Van Clines M.D.   On: 05/14/2015 17:57    EKG:   Orders placed or performed during the hospital encounter of 04/26/15  . ED EKG  . ED EKG    ASSESSMENT AND PLAN:   Ross [name] is a 54 y.o. male with a known history of hypertension, diabetes mellitus, gastroesophageal reflux disease comes to the hospital from PCP office secondary to persistent right upper quadrant and also epigastric pain.  #1 acute epigastric abdominal pain with elevated LFTs-secondary to acute pancreatitis  MRCP with no gallstones, done on 11/11 Triglycerides are slightly elevated in 200s Denies any history of alcohol him- ultrasound done about a week ago with no gallstones. -However LFTs are significantly elevated with elevated bilirubin for now. - appreciate GI consultation -Not considering ERCP at this time -Continue nothing by mouth, IV fluids and pain management -Acute hepatitis panel pending -Repeat a.m. labs including CMP and lipase  #2 hepatic steatosis-fiber scan done as an outpatient. Following up with GI. 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 -Hold atorvastatin and fenofibrate  #3 diabetes mellitus- Invokana now has been stopped as an outpatient due to possible side effect of pancreatitis -Hold metformin -Provide  sliding scale insulin.  - -If sugars are elevated will start Lantus  #4 hypertension-losartan, hydrochlorothiazide are on hold while labs are pending  #5 gastroesophageal reflux disease-IV Protonix for now. -Carafate. Will need EGD at some point  #6 DVT prophylaxis-on subcutaneous heparin     All the records are reviewed and case discussed with Care Management/Social Workerr. Management plans discussed with the patient, family and they are in agreement.  CODE STATUS: Full code, wife  TOTAL TIME TAKING CARE OF THIS PATIENT: 35 minutes.   POSSIBLE D/C IN  DA2-3YS, DEPENDING ON CLINICAL CONDITION.   Nicholes Mango M.D on 05/15/2015 at 3:36  PM  Between 7am to 6pm - Pager - [phone] After 6pm go to [email or web address] - password [email or web address] [facility]  Office  [phone]  CC: Primary care physician; Perrin Maltese, MD

## 2015-05-16 ENCOUNTER — Encounter: Payer: Self-pay | Admitting: Gastroenterology

## 2015-05-16 LAB — COMPREHENSIVE METABOLIC PANEL
ALK PHOS: 153 U/L — AB (ref 38–126)
ALT: 245 U/L — AB (ref 17–63)
ALT: 215 U/L — ABNORMAL HIGH (ref 17–63)
ANION GAP: 5 (ref 5–15)
AST: 69 U/L — AB (ref 15–41)
AST: 51 U/L — ABNORMAL HIGH (ref 15–41)
Albumin: 3.1 g/dL — ABNORMAL LOW (ref 3.5–5.0)
Albumin: 3.4 g/dL — ABNORMAL LOW (ref 3.5–5.0)
Alkaline Phosphatase: 154 U/L — ABNORMAL HIGH (ref 38–126)
Anion gap: 4 — ABNORMAL LOW (ref 5–15)
BILIRUBIN TOTAL: 1.9 mg/dL — AB (ref 0.3–1.2)
BILIRUBIN TOTAL: 1.7 mg/dL — AB (ref 0.3–1.2)
BUN: 14 mg/dL (ref 6–20)
BUN: 12 mg/dL (ref 6–20)
CALCIUM: 8.9 mg/dL (ref 8.9–10.3)
CHLORIDE: 109 mmol/L (ref 101–111)
CO2: 24 mmol/L (ref 22–32)
CO2: 26 mmol/L (ref 22–32)
CREATININE: 1.01 mg/dL (ref 0.61–1.24)
Calcium: 8.7 mg/dL — ABNORMAL LOW (ref 8.9–10.3)
Chloride: 105 mmol/L (ref 101–111)
Creatinine, Ser: 0.95 mg/dL (ref 0.61–1.24)
GLUCOSE: 179 mg/dL — AB (ref 65–99)
Glucose, Bld: 161 mg/dL — ABNORMAL HIGH (ref 65–99)
POTASSIUM: 3.5 mmol/L (ref 3.5–5.1)
Potassium: 3.5 mmol/L (ref 3.5–5.1)
Sodium: 137 mmol/L (ref 135–145)
Sodium: 136 mmol/L (ref 135–145)
TOTAL PROTEIN: 6 g/dL — AB (ref 6.5–8.1)
TOTAL PROTEIN: 6.5 g/dL (ref 6.5–8.1)

## 2015-05-16 LAB — GLUCOSE, CAPILLARY
GLUCOSE-CAPILLARY: 165 mg/dL — AB (ref 65–99)
GLUCOSE-CAPILLARY: 148 mg/dL — AB (ref 65–99)
GLUCOSE-CAPILLARY: 154 mg/dL — AB (ref 65–99)
Glucose-Capillary: 170 mg/dL — ABNORMAL HIGH (ref 65–99)

## 2015-05-16 LAB — LIPASE, BLOOD: LIPASE: 39 U/L (ref 11–51)

## 2015-05-16 NOTE — Consult Note (Signed)
  GI Inpatient Follow-up Note  Patient Identification: Ross [name] is a 54 y.o. male  Subjective: No more abdominal pain. Lipase back to normal. LFT improving.  Scheduled Inpatient Medications:  . docusate sodium  100 mg Oral BID  . heparin  5,000 Units Subcutaneous 3 times per day  . Influenza vac split quadrivalent PF  0.5 mL Intramuscular Tomorrow-1000  . insulin aspart  0-15 Units Subcutaneous TID WC  . insulin aspart  0-5 Units Subcutaneous QHS  . metFORMIN  500 mg Oral BID WC  . pantoprazole (PROTONIX) IV  40 mg Intravenous Q12H  . pneumococcal 23 valent vaccine  0.5 mL Intramuscular Tomorrow-1000  . pravastatin  20 mg Oral q1800  . sucralfate  1 g Oral TID WC & HS    Continuous Inpatient Infusions:   . dextrose 5 % and 0.9% NaCl 100 mL/hr at 05/16/15 0148    PRN Inpatient Medications:  acetaminophen **OR** acetaminophen, diphenhydrAMINE, HYDROcodone-acetaminophen, HYDROmorphone (DILAUDID) injection, methocarbamol, ondansetron **OR** ondansetron (ZOFRAN) IV, polyethylene glycol, promethazine, traMADol  Review of Systems: Constitutional: Weight is stable.  Eyes: No changes in vision. ENT: No oral lesions, sore throat.  GI: see HPI.  Heme/Lymph: No easy bruising.  CV: No chest pain.  GU: No hematuria.  Integumentary: No rashes.  Neuro: No headaches.  Psych: No depression/anxiety.  Endocrine: No heat/cold intolerance.  Allergic/Immunologic: No urticaria.  Resp: No cough, SOB.  Musculoskeletal: No joint swelling.    Physical Examination: BP 112/57 mmHg  Pulse 57  Temp(Src) 97.6 F (36.4 C) (Oral)  Resp 18  Ht $R'6\' 2"'kz$  (1.88 m)  Wt 122.925 kg (271 lb)  BMI 34.78 kg/m2  SpO2 96% Gen: NAD, alert and oriented x 4 HEENT: PEERLA, EOMI, Neck: supple, no JVD or thyromegaly Chest: CTA bilaterally, no wheezes, crackles, or other adventitious sounds CV: RRR, no m/g/c/r Abd: soft, nontender today, ND, +BS in all four quadrants; no HSM, guarding, ridigity, or rebound  tenderness Ext: no edema, well perfused with 2+ pulses, Skin: no rash or lesions noted Lymph: no LAD  Data: Lab Results  Component Value Date   WBC 6.2 05/15/2015   HGB 15.5 05/15/2015   HCT 44.5 05/15/2015   MCV 89.1 05/15/2015   PLT 229 05/15/2015    Recent Labs Lab 05/15/15 0441  HGB 15.5   Lab Results  Component Value Date   NA 137 05/16/2015   K 3.5 05/16/2015   CL 109 05/16/2015   CO2 24 05/16/2015   BUN 14 05/16/2015   CREATININE 1.01 05/16/2015   Lab Results  Component Value Date   ALT 245* 05/16/2015   AST 69* 05/16/2015   ALKPHOS 154* 05/16/2015   BILITOT 1.9* 05/16/2015    Recent Labs Lab 05/15/15 0441  INR 1.13   Assessment/Plan: Mr. Wolman is a 54 y.o. male with possible gallstone pancreatitis with LFT elevation. However, U/S did not show gallstones. Pt also has chronic liver disease, which may be a source of LFT elevation.  Recommendations: Clear liquid diet. Advance slowly as tolerated. Continue to moniter LFT and lipase. If pt can tolerates solids without significant abdominal pain, then pt can be discharged later. Will consider ERCP later as outpt if LFT, esp T.bili/alk phos remains elevated.  Please call with questions or concerns.  Anden Bartolo, Lupita Dawn, MD

## 2015-05-16 NOTE — Plan of Care (Signed)
Problem: Safety: Goal: Ability to remain free from injury will improve Outcome: Progressing Low fall risk, pt is independent with ambulation. Patient has remained free from injury this shift.      Problem: Health Behavior/Discharge Planning: Goal: Ability to manage health-related needs will improve Outcome: Progressing Patient improving, lipase improved, now at 39. AST and ALT also improving. IVF infusing per MD order. No c/o pain or nausea this shift.

## 2015-05-16 NOTE — Plan of Care (Signed)
Problem: Safety: Goal: Ability to remain free from injury will improve Outcome: Completed/Met Date Met:  05/16/15 Pt is a low fall risk. Low fall risk precautions maintained. Ambulates independently without difficulty. Pt remained free of injury.  Problem: Pain Managment: Goal: General experience of comfort will improve Outcome: Completed/Met Date Met:  05/16/15 Pt denies pain during the shift.  Problem: Tissue Perfusion: Goal: Risk factors for ineffective tissue perfusion will decrease Outcome: Progressing Pt ambulates independently without difficulty. Heparin, teds and SCD's continues.  Problem: Activity: Goal: Risk for activity intolerance will decrease Outcome: Completed/Met Date Met:  05/16/15 Pt ambulates independently without difficulty.  Problem: Nutrition: Goal: Adequate nutrition will be maintained Outcome: Progressing Lipase WDL. Liver enzymes improving. Pt tolerates clear liquid diet. Denies pain/nausea. Diet advanced to full liquid. CBG's monitored. Insulin given. IVF Continues  Problem: Nutritional: Goal: Ability to achieve adequate nutritional intake will improve Outcome: Progressing Lipase WDL. Liver enzymes improving. Pt tolerates clear liquid diet. Denies pain/nausea. Diet advanced to full liquid. CBG's monitored. Insulin given. IVF Continues

## 2015-05-16 NOTE — Progress Notes (Signed)
[place] at [place] NAME: Ross [name]    MR#:  [id number]  DATE OF BIRTH:  12-09-1960  SUBJECTIVE:  CHIEF COMPLAINT:  Patient denies any epigastric abdominal pain. Tolerating clear liquids. Very hungry. Denies any nausea or vomiting.  REVIEW OF SYSTEMS:  CONSTITUTIONAL: No fever, fatigue or weakness.  EYES: No blurred or double vision.  EARS, NOSE, AND THROAT: No tinnitus or ear pain.  RESPIRATORY: No cough, shortness of breath, wheezing or hemoptysis.  CARDIOVASCULAR: No chest pain, orthopnea, edema.  GASTROINTESTINAL: No nausea, vomiting, diarrhea or abdominal pain.  GENITOURINARY: No dysuria, hematuria.  ENDOCRINE: No polyuria, nocturia,  HEMATOLOGY: No anemia, easy bruising or bleeding SKIN: No rash or lesion. MUSCULOSKELETAL: No joint pain or arthritis.   NEUROLOGIC: No tingling, numbness, weakness.  PSYCHIATRY: No anxiety or depression.   DRUG ALLERGIES:   Allergies  Allergen Reactions  . Morphine And Related Nausea And Vomiting  . Penicillins Other (See Comments)    Unknown reaction - happened as a child  . Sulfa Antibiotics Other (See Comments)    Unknown reaction - happened when was a child    VITALS:  Blood pressure 112/57, pulse 57, temperature 97.6 F (36.4 C), temperature source Oral, resp. rate 18, height 6\' 2"  (1.88 m), weight 122.925 kg (271 lb), SpO2 96 %.  PHYSICAL EXAMINATION:  GENERAL:  54 y.o.-year-old patient lying in the bed with no acute distress.  EYES: Pupils equal, round, reactive to light and accommodation. No scleral icterus. Extraocular muscles intact.  HEENT: Head atraumatic, normocephalic. Oropharynx and nasopharynx clear.  NECK:  Supple, no jugular venous distention. No thyroid enlargement, no tenderness.  LUNGS: Normal breath sounds bilaterally, no wheezing, rales,rhonchi or crepitation. No use of accessory muscles of respiration.  CARDIOVASCULAR: S1, S2 normal. No murmurs, rubs, or  gallops.  ABDOMEN: Soft, nontender, nondistended. Bowel sounds present. No organomegaly or mass.  EXTREMITIES: No pedal edema, cyanosis, or clubbing.  NEUROLOGIC: Cranial nerves II through XII are intact. Muscle strength 5/5 in all extremities. Sensation intact. Gait not checked.  PSYCHIATRIC: The patient is alert and oriented x 3.  SKIN: No obvious rash, lesion, or ulcer.    LABORATORY PANEL:   CBC  Recent Labs Lab 05/15/15 0441  WBC 6.2  HGB 15.5  HCT 44.5  PLT 229   ------------------------------------------------------------------------------------------------------------------  Chemistries   Recent Labs Lab 05/16/15 0446  NA 137  K 3.5  CL 109  CO2 24  GLUCOSE 161*  BUN 14  CREATININE 1.01  CALCIUM 8.7*  AST 69*  ALT 245*  ALKPHOS 154*  BILITOT 1.9*   ------------------------------------------------------------------------------------------------------------------  Cardiac Enzymes No results for input(s): TROPONINI in the last 168 hours. ------------------------------------------------------------------------------------------------------------------  RADIOLOGY:  Mr Abd W/wo Cm/mrcp  05/14/2015  CLINICAL DATA:  Increasing jaundice and abdominal pain. Nausea and vomiting since April 26, 2015. EXAM: MRI ABDOMEN WITHOUT AND WITH CONTRAST (INCLUDING MRCP) TECHNIQUE: Multiplanar multisequence MR imaging of the abdomen was performed both before and after the administration of intravenous contrast. Heavily T2-weighted images of the biliary and pancreatic ducts were obtained, and three-dimensional MRCP images were rendered by post processing. CONTRAST:  71mL MULTIHANCE GADOBENATE DIMEGLUMINE 529 MG/ML IV SOLN COMPARISON:  Multiple exams, including 04/26/2015 FINDINGS: Despite efforts by the technologist and patient, motion artifact is present on today's exam and could not be eliminated. This reduces exam sensitivity and specificity. Lower chest: Linear scarring or  subsegmental atelectasis in the right lower lobe. Hepatobiliary: Periportal edema. Diffuse hepatic steatosis. Gallbladder wall  thickening. The common hepatic duct measures 6 mm in diameter. The common bile duct measures 6 mm in diameter. No filling defect is identified. No beading or irregularity of the biliary tree. No abnormal wall thickening in the CBD or common hepatic duct. There is a small amount of edema surrounding the gallbladder. Several tiny hepatic cysts are present and there is sludge in the gallbladder. Pancreas: Pancreatic and faint peripancreatic edema suspicious for acute pancreatitis. No dorsal pancreatic duct dilatation. No pancreatic mass, abscess, necrosis, or pseudocyst. Spleen: Unremarkable Adrenals/Urinary Tract: Several tiny renal cysts are present, including a 1.2 cm cyst in the right kidney upper pole. This is likely a Bosniak category 2 cyst based on a faint internal septation. A mass of the medial limb left adrenal gland measures 1.0 by 1.8 cm and technically nonspecific for adenoma. Stomach/Bowel: Unremarkable Vascular/Lymphatic: No vascular abnormality. Small porta hepatis lymph nodes are likely reactive. Other: No supplemental non-categorized findings. Musculoskeletal: Unremarkable IMPRESSION: 1. Gallbladder wall thickening with a small amount of pericholecystic fluid and sludge in the gallbladder. No gallstones are identified. Acute cholecystitis is not excluded. 2. Borderline prominent extrahepatic biliary tree but no wall thickening or filling defect in the common bile duct. If clinically warranted, nuclear medicine hepatobiliary scan could be utilized to assess for hepatocellular dysfunction and also to assess for cholecystitis. There is generalized hepatic steatosis. 3. Abnormal edema throughout the pancreatic parenchyma and mild peripancreatic edema, compatible with acute pancreatitis. No pancreatic mass, abscess, pseudocyst, or necrosis identified. 4. Mild periportal edema,  nonspecific. 5. Small mass of the medial limb left adrenal gland is nonspecific based on imaging characteristics. Electronically Signed   By: Van Clines M.D.   On: 05/14/2015 17:57    EKG:   Orders placed or performed during the hospital encounter of 04/26/15  . ED EKG  . ED EKG    ASSESSMENT AND PLAN:   Ross [name] is a 54 y.o. male with a known history of hypertension, diabetes mellitus, gastroesophageal reflux disease comes to the hospital from PCP office secondary to persistent right upper quadrant and also epigastric pain.  #1 acute epigastric abdominal pain with elevated LFTs-secondary to acute pancreatitis  Acute pancreatitis can be from metformin/passing gallstones with underlying chronic liver disease MRCP with no gallstones, done on 11/11 On clear liquids, will advance diet to full liquids as tolerated. We'll advance diet to soft in a.m. and consider discharging if patient could tolerate soft diet in a.m. Triglycerides are slightly elevated in 200s, which should not cause pancreatitis LFTs are improving. Repeat a.m. labs including CMP Lipase is back to normal - appreciate GI recommendations -Not considering ERCP at this time -Recommending to hold Carafate for now and continue Protonix  #2 hepatic steatosis-fiber scan done as an outpatient. Following up with GI. 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 -Hold atorvastatin and fenofibrate  #3 diabetes mellitus- Invokana now has been stopped as an outpatient due to possible side effect of pancreatitis -Discontinue metformin -Provide  sliding scale insulin.  -Can start him on glipizide, home medication if patient is tolerating soft diet -If sugars are elevated will start Lantus  #4 hypertension-losartan, hydrochlorothiazide are on hold while labs are pending  #5 gastroesophageal reflux disease-IV Protonix for now. -Discontinue Carafate as per GI recommendations. Will need EGD at some point as an outpatient  #6 DVT  prophylaxis-on subcutaneous heparin     All the records are reviewed and case discussed with Care Management/Social Workerr. Management plans discussed with the patient, family and they are in agreement.  CODE STATUS: Full code, wife  TOTAL TIME TAKING CARE OF THIS PATIENT: 35 minutes.   POSSIBLE D/C IN a.m., DEPENDING ON CLINICAL CONDITION.   Nicholes Mango M.D on 05/16/2015 at 2:10 PM  Between 7am to 6pm - Pager - [phone] After 6pm go to [email or web address] - password [email or web address] [place] [facility]  Office  [phone]  CC: Primary care physician; Perrin Maltese, MD

## 2015-05-17 ENCOUNTER — Encounter: Admission: RE | Payer: Self-pay | Source: Ambulatory Visit

## 2015-05-17 ENCOUNTER — Ambulatory Visit
Admission: RE | Admit: 2015-05-17 | Payer: BC Managed Care – PPO | Source: Ambulatory Visit | Admitting: Gastroenterology

## 2015-05-17 HISTORY — DX: Post-traumatic stress disorder, unspecified: F43.10

## 2015-05-17 HISTORY — DX: Sleep apnea, unspecified: G47.30

## 2015-05-17 HISTORY — DX: Hyperlipidemia, unspecified: E78.5

## 2015-05-17 HISTORY — DX: Gastro-esophageal reflux disease without esophagitis: K21.9

## 2015-05-17 LAB — CBC
HCT: 40.5 % (ref 40.0–52.0)
Hemoglobin: 14.1 g/dL (ref 13.0–18.0)
MCH: 31.1 pg (ref 26.0–34.0)
MCHC: 34.9 g/dL (ref 32.0–36.0)
MCV: 89.3 fL (ref 80.0–100.0)
PLATELETS: 196 10*3/uL (ref 150–440)
RBC: 4.54 MIL/uL (ref 4.40–5.90)
RDW: 12.2 % (ref 11.5–14.5)
WBC: 4.4 10*3/uL (ref 3.8–10.6)

## 2015-05-17 LAB — GLUCOSE, CAPILLARY: Glucose-Capillary: 208 mg/dL — ABNORMAL HIGH (ref 65–99)

## 2015-05-17 LAB — LIPASE, BLOOD: Lipase: 32 U/L (ref 11–51)

## 2015-05-17 SURGERY — COLONOSCOPY WITH PROPOFOL
Anesthesia: General

## 2015-05-17 MED ORDER — HYDROCODONE-ACETAMINOPHEN 5-325 MG PO TABS: 1.0000 | ORAL_TABLET | Freq: Four times a day (QID) | ORAL | Status: AC | PRN

## 2015-05-17 MED ORDER — INSULIN GLARGINE 100 UNIT/ML ~~LOC~~ SOLN
10.0000 [IU] | Freq: Every day | SUBCUTANEOUS | Status: AC
Start: 2015-05-17 — End: ?

## 2015-05-17 NOTE — Discharge Instructions (Signed)

## 2015-05-17 NOTE — Discharge Summary (Signed)
[place] [facility] [place] NAME: Ross [name]    MR#:  [id number]  DATE OF BIRTH:  [birth year]  DATE OF ADMISSION:  05/14/2015 ADMITTING PHYSICIAN: Gladstone Lighter, MD  DATE OF DISCHARGE: 05/17/2015  PRIMARY CARE PHYSICIAN: Perrin Maltese, MD    ADMISSION DIAGNOSIS:  Abdominal Pain Nausea and Vomiting  DISCHARGE DIAGNOSIS:  Active Problems:   Abdominal pain    Acute pancreatitis  SECONDARY DIAGNOSIS:   Past Medical History  Diagnosis Date  . Diabetes mellitus without complication ([place])   . Hypertension   . GERD (gastroesophageal reflux disease)   . Hyperlipidemia   . Sleep apnea   . PTSD (post-traumatic stress disorder)     HOSPITAL COURSE:   #1 acute epigastric abdominal pain with elevated LFTs-secondary to acute pancreatitis  Acute pancreatitis can be from metformin/passing gallstones with underlying chronic liver disease MRCP with no gallstones, done on 11/11 On clear liquids, advanced diet to soft and tolerated well. Triglycerides are slightly elevated in 200s, which should not cause pancreatitis LFTs are improving. Lipase is back to normal - appreciate GI recommendations -Not considering ERCP at this time -Recommending to hold Carafate for now and continue Protonix - follow with GI clinic in 2 weeks.  #2 hepatic steatosis- Following up with GI. 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 -Held atorvastatin and fenofibrate in hospital.  #3 diabetes mellitus- Invokana now has been stopped as an outpatient due to possible side effect of pancreatitis -Discontinued metformin -Provide sliding scale insulin.  - start him on glipizide, home medication as patient is tolerating soft diet -resume lantus- and he is suppose to follow closely with PMD to adjust the dose as it was recently started.  #4 hypertension-losartan, hydrochlorothiazide were on hold while NPO    Resume on d./c.  #5 gastroesophageal reflux disease-IV  Protonix for now. -Discontinue Carafate as per GI recommendations. Will need EGD at some point as an outpatient  DISCHARGE CONDITIONS:   Stable.  CONSULTS OBTAINED:  Treatment Team:  Hulen Luster, MD  DRUG ALLERGIES:   Allergies  Allergen Reactions  . Morphine And Related Nausea And Vomiting  . Penicillins Other (See Comments)    Unknown reaction - happened as a child  . Sulfa Antibiotics Other (See Comments)    Unknown reaction - happened when was a child    DISCHARGE MEDICATIONS:   Current Discharge Medication List    START taking these medications   Details  HYDROcodone-acetaminophen (NORCO/VICODIN) 5-325 MG tablet Take 1 tablet by mouth every 6 (six) hours as needed for moderate pain. Qty: 15 tablet, Refills: 0    insulin glargine (LANTUS) 100 UNIT/ML injection Inject 0.1 mLs (10 Units total) into the skin at bedtime. Qty: 10 mL, Refills: 11      CONTINUE these medications which have NOT CHANGED   Details  aspirin EC 81 MG tablet Take 81 mg by mouth daily.    atorvastatin (LIPITOR) 20 MG tablet Take 20 mg by mouth at bedtime.    fenofibrate (TRICOR) 145 MG tablet Take 145 mg by mouth daily.    glipiZIDE (GLUCOTROL) 10 MG tablet Take 10 mg by mouth 2 (two) times daily.    losartan-hydrochlorothiazide (HYZAAR) 100-25 MG tablet Take 1 tablet by mouth daily.    ondansetron (ZOFRAN ODT) 4 MG disintegrating tablet Take 1 tablet (4 mg total) by mouth every 8 (eight) hours as needed for nausea or vomiting. Qty: 20 tablet, Refills: 0    senna (SENOKOT) 8.6 MG tablet Take 2  tablets by mouth every morning.    methocarbamol (ROBAXIN) 750 MG tablet Take 750 mg by mouth at bedtime as needed for muscle spasms.    omeprazole (PRILOSEC) 40 MG capsule Take 40 mg by mouth daily.    traMADol (ULTRAM) 50 MG tablet Take by mouth every 6 (six) hours as needed.      STOP taking these medications     HYDROmorphone (DILAUDID) 2 MG tablet      INVOKANA 300 MG TABS tablet       metFORMIN (GLUCOPHAGE) 500 MG tablet      ranitidine (ZANTAC) 150 MG tablet      sucralfate (CARAFATE) 1 G tablet          DISCHARGE INSTRUCTIONS:   Follow with GI in office in 2 weeks.  If you experience worsening of your admission symptoms, develop shortness of breath, life threatening emergency, suicidal or homicidal thoughts you must seek medical attention immediately by calling 911 or calling your MD immediately  if symptoms less severe.  You Must read complete instructions/literature along with all the possible adverse reactions/side effects for all the Medicines you take and that have been prescribed to you. Take any new Medicines after you have completely understood and accept all the possible adverse reactions/side effects.   Please note  You were cared for by a hospitalist during your hospital stay. If you have any questions about your discharge medications or the care you received while you were in the hospital after you are discharged, you can call the unit and asked to speak with the hospitalist on call if the hospitalist that took care of you is not available. Once you are discharged, your primary care physician will handle any further medical issues. Please note that NO REFILLS for any discharge medications will be authorized once you are discharged, as it is imperative that you return to your primary care physician (or establish a relationship with a primary care physician if you do not have one) for your aftercare needs so that they can reassess your need for medications and monitor your lab values.    Today   CHIEF COMPLAINT:  No chief complaint on file.   HISTORY OF PRESENT ILLNESS:  Ross [name]  is a 54 y.o. male with a known history of hypertension, diabetes mellitus, gastroesophageal reflux disease comes to the hospital from PCP office secondary to persistent right upper quadrant and also epigastric pain. Patient's symptoms started about 3 weeks ago with  abdominal pain radiating to the back, he had 2 ER visits to [facility] and had CT of the abdomen done at that time and was discharged home for treatment of gastritis. Gallbladder and pancreas looked okay on the CT. Patient did okay for a couple of days and then had to go to [facility] emergency room for similar complaints. He was seen by GI as an outpatient about a week ago. Scheduled to have EGD and colonoscopy in 3 days. His symptoms got worse, he was noticed to have Aida Puffer and went to visit his PCP today. Says his urine is*color, stools are pale. Denies any hematemesis or melena. He complains of ongoing weakness and worsening epigastric and right upper quadrant pain. Labs done at the PCPs office indicated that his LFTs, AST and ALT, alkaline phosphatase and bilirubin are elevated. So she sent for direct admission. Bili is as high as 7.4 today. His last bilirubin 3 weeks ago was completely normal.   VITAL SIGNS:  Blood pressure 140/71, pulse 69, temperature  98.5 F (36.9 C), temperature source Oral, resp. rate 18, height 6\' 2"  (1.88 m), weight 122.925 kg (271 lb), SpO2 97 %.  I/O:    Intake/Output Summary (Last 24 hours) at 05/17/15 0916 Last data filed at 05/17/15 0618  Gross per 24 hour  Intake   1440 ml  Output      0 ml  Net   1440 ml    PHYSICAL EXAMINATION:  GENERAL:  54 y.o.-year-old obase patient lying in the bed with no acute distress.  EYES: Pupils equal, round, reactive to light and accommodation. No scleral icterus. Extraocular muscles intact.  HEENT: Head atraumatic, normocephalic. Oropharynx and nasopharynx clear.  NECK:  Supple, no jugular venous distention. No thyroid enlargement, no tenderness.  LUNGS: Normal breath sounds bilaterally, no wheezing, rales,rhonchi or crepitation. No use of accessory muscles of respiration.  CARDIOVASCULAR: S1, S2 normal. No murmurs, rubs, or gallops.  ABDOMEN: Soft, non-tender, non-distended. Bowel sounds present. No organomegaly or mass.   EXTREMITIES: No pedal edema, cyanosis, or clubbing.  NEUROLOGIC: Cranial nerves II through XII are intact. Muscle strength 5/5 in all extremities. Sensation intact. Gait not checked.  PSYCHIATRIC: The patient is alert and oriented x 3.  SKIN: No obvious rash, lesion, or ulcer.   DATA REVIEW:   CBC  Recent Labs Lab 05/17/15 0520  WBC 4.4  HGB 14.1  HCT 40.5  PLT 196    Chemistries   Recent Labs Lab 05/16/15 1509  NA 136  K 3.5  CL 105  CO2 26  GLUCOSE 179*  BUN 12  CREATININE 0.95  CALCIUM 8.9  AST 51*  ALT 215*  ALKPHOS 153*  BILITOT 1.7*    Cardiac Enzymes No results for input(s): TROPONINI in the last 168 hours.  Microbiology Results  No results found for this or any previous visit.  RADIOLOGY:  No results found.  EKG:   Orders placed or performed during the hospital encounter of 04/26/15  . ED EKG  . ED EKG      Management plans discussed with the patient, family and they are in agreement.  CODE STATUS:     Code Status Orders        Start     Ordered   05/14/15 1338  Full code   Continuous     05/14/15 1339      TOTAL TIME TAKING CARE OF THIS PATIENT: 35 minutes.    Vaughan Basta M.D on 05/17/2015 at 9:16 AM  Between 7am to 6pm - Pager - [phone]  After 6pm go to [email or web address] - password [email or web address] [facility]  Office  [phone]  CC: Primary care physician; Perrin Maltese, MD   Note: This dictation was prepared with Dragon dictation along with smaller phrase technology. Any transcriptional errors that result from this process are unintentional.

## 2015-05-17 NOTE — Plan of Care (Addendum)
Problem: Nutritional: Goal: Ability to achieve adequate nutritional intake will improve Outcome: Completed/Met Date Met:  05/17/15 MD making rounds. Discharge orders received. Appointment Scheduled. IV removed. Prescription given to patient. Discharge paperwork provided, explained, signed and witnessed. Education handouts provided to patient. No unanswered questions. Refusing wheelchair due to visiting another patient. All belongings sent with patient and family.

## 2015-05-17 NOTE — Plan of Care (Signed)
Problem: Health Behavior/Discharge Planning: Goal: Ability to manage health-related needs will improve Outcome: Progressing Pt alert and oriented X's 4. Verbally expressed onset of symptoms and plan if it reoccurs.   Problem: Tissue Perfusion: Goal: Risk factors for ineffective tissue perfusion will decrease Outcome: Progressing Pt is ambulatory, up to BR independently. Remains on room air, O2 sats WNL.   Problem: Nutrition: Goal: Adequate nutrition will be maintained Outcome: Progressing Tolerating diet yesterday, started with clear liquid and advance to full. Pt requesting regular food today.   Problem: Nutritional: Goal: Ability to achieve adequate nutritional intake will improve Outcome: Progressing Tolerating diet yesterday, started with clear liquid and advance to full. Pt requesting regular food today.

## 2017-07-24 ENCOUNTER — Other Ambulatory Visit: Payer: Self-pay

## 2017-08-14 ENCOUNTER — Ambulatory Visit: Payer: Self-pay | Admitting: Urology

## 2017-08-14 ENCOUNTER — Encounter: Payer: Self-pay | Admitting: Urology

## 2021-07-23 ENCOUNTER — Encounter: Payer: Self-pay | Admitting: Emergency Medicine

## 2021-07-23 ENCOUNTER — Emergency Department: Payer: 59

## 2021-07-23 ENCOUNTER — Other Ambulatory Visit: Payer: Self-pay

## 2021-07-23 ENCOUNTER — Emergency Department
Admission: EM | Admit: 2021-07-23 | Discharge: 2021-07-23 | Discharge status: Against Medical Advice | Payer: 59 | Attending: Emergency Medicine | Admitting: Emergency Medicine

## 2021-07-23 DIAGNOSIS — Z5321 Procedure and treatment not carried out due to patient leaving prior to being seen by health care provider: Secondary | ICD-10-CM | POA: Diagnosis not present

## 2021-07-23 DIAGNOSIS — R072 Precordial pain: Secondary | ICD-10-CM | POA: Diagnosis not present

## 2021-07-23 LAB — BASIC METABOLIC PANEL
Anion gap: 11 (ref 5–15)
BUN: 16 mg/dL (ref 6–20)
CO2: 23 mmol/L (ref 22–32)
Calcium: 8.7 mg/dL — ABNORMAL LOW (ref 8.9–10.3)
Chloride: 101 mmol/L (ref 98–111)
Creatinine, Ser: 0.76 mg/dL (ref 0.61–1.24)
GFR, Estimated: >60 mL/min (ref >60)
Glucose, Bld: 172 mg/dL — ABNORMAL HIGH (ref 70–99)
Potassium: 3.3 mmol/L — ABNORMAL LOW (ref 3.5–5.1)
Sodium: 135 mmol/L (ref 135–145)

## 2021-07-23 LAB — CBC
HCT: 48.5 % (ref 39.0–52.0)
Hemoglobin: 17.2 g/dL — ABNORMAL HIGH (ref 13.0–17.0)
MCH: 31.5 pg (ref 26.0–34.0)
MCHC: 35.5 g/dL (ref 30.0–36.0)
MCV: 88.8 fL (ref 80.0–100.0)
Platelets: 155 10*3/uL (ref 150–400)
RBC: 5.46 MIL/uL (ref 4.22–5.81)
RDW: 12.4 % (ref 11.5–15.5)
WBC: 8.4 10*3/uL (ref 4.0–10.5)
nRBC: 0 % (ref 0.0–0.2)

## 2021-07-23 LAB — TROPONIN I (HIGH SENSITIVITY)
Troponin I (High Sensitivity): <2 ng/L (ref <18)
Troponin I (High Sensitivity): <2 ng/L (ref <18)

## 2021-07-23 NOTE — ED Triage Notes (Signed)
Pt to ED via POV with c/o substernal CP that started yesterday afternoon. Pt denies radiation. Pt states went to the dentist on 1/19 and they injected numbing medicine, pt states CP started the day after that. Pt states pain worse with inspiration.

## 2021-07-23 NOTE — ED Notes (Signed)
Pt told registation he was feeling better and was going to leave

## 2022-04-21 IMAGING — CR DG CHEST 2V
2 series · 2 of 2 positions shown · non-contrast
Comparison: Chest radiographs 08/17/2014 and earlier.

CLINICAL DATA: 60-year-old male with chest pain since yesterday.
Substernal pain with inspiration.

EXAM:
CHEST - 2 VIEW

[chest pa]
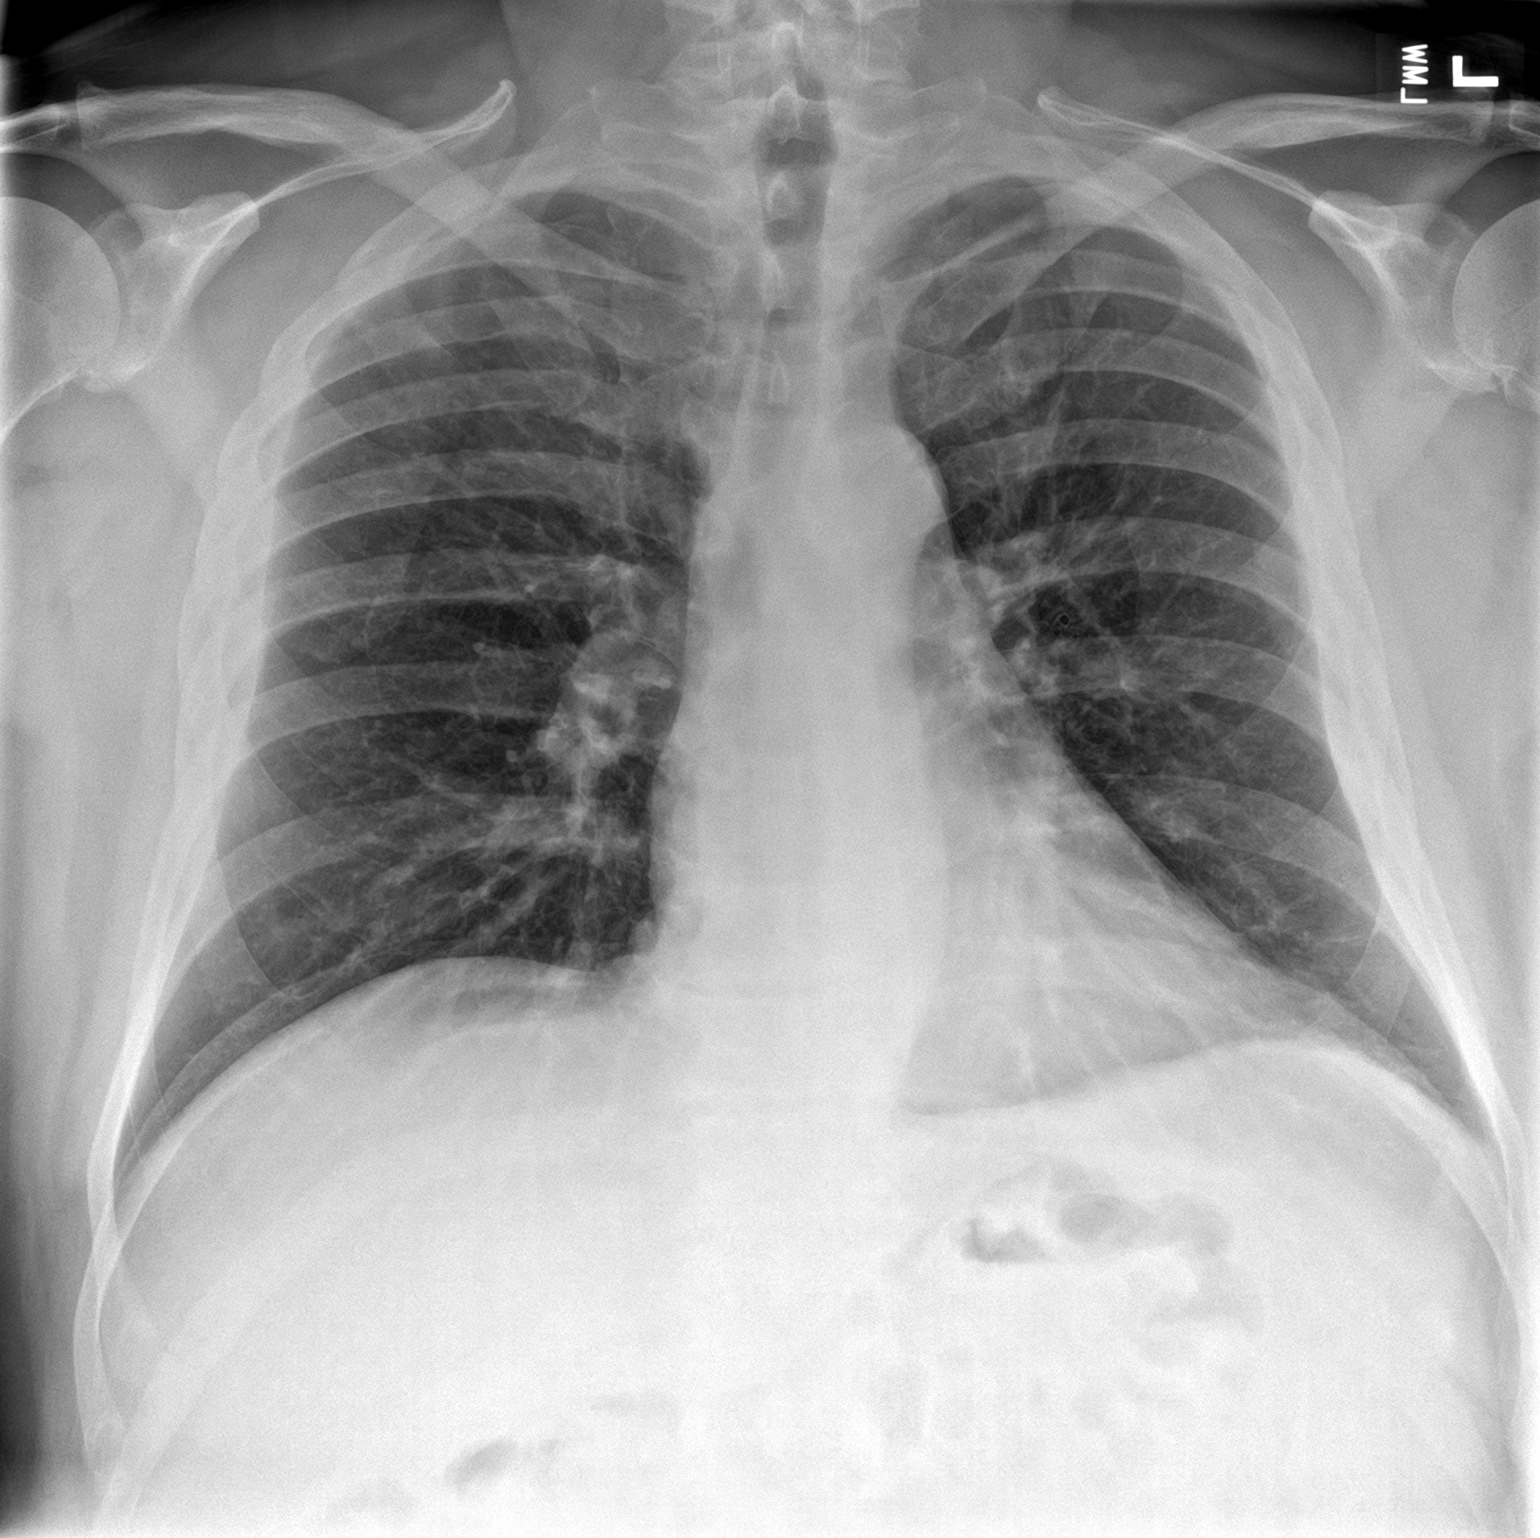

[chest lat]
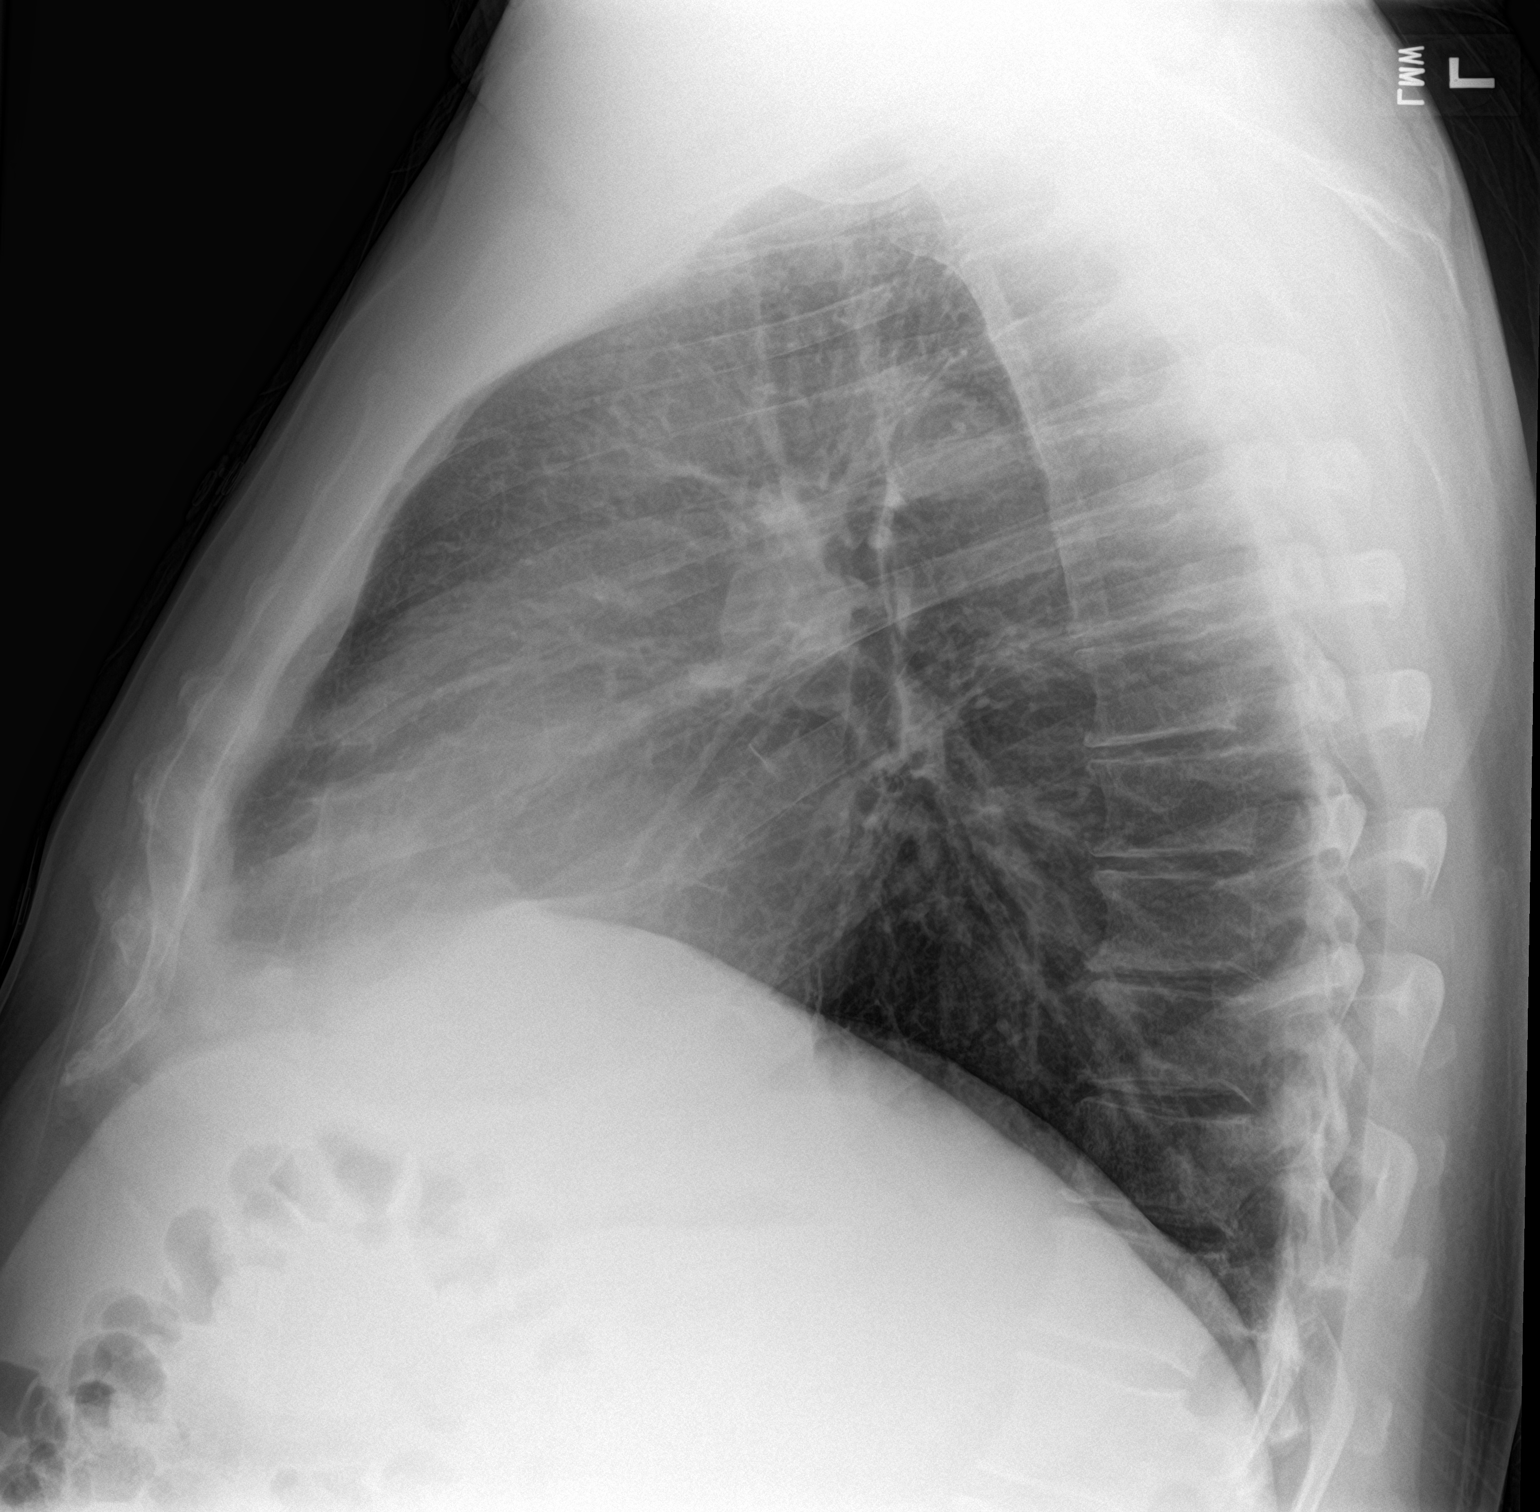

[2 of 2 positions shown; findings below may reference images not displayed]

FINDINGS: Lung volumes and mediastinal contours are stable since 9323 and
within normal limits. Visualized tracheal air column is within
normal limits. Lung markings appear stable and within normal limits.
No pneumothorax or pleural effusion.

No acute osseous abnormality identified. Negative visible bowel gas.
IMPRESSION: Negative.  No cardiopulmonary abnormality.

## 2022-08-17 ENCOUNTER — Other Ambulatory Visit: Payer: Self-pay | Admitting: Student

## 2022-08-17 DIAGNOSIS — M7521 Bicipital tendinitis, right shoulder: Secondary | ICD-10-CM

## 2022-08-17 DIAGNOSIS — M19011 Primary osteoarthritis, right shoulder: Secondary | ICD-10-CM

## 2022-08-17 DIAGNOSIS — M7581 Other shoulder lesions, right shoulder: Secondary | ICD-10-CM

## 2022-09-01 ENCOUNTER — Ambulatory Visit
Admission: RE | Admit: 2022-09-01 | Discharge: 2022-09-01 | Disposition: A | Discharge status: Home / Self Care | Payer: BLUE CROSS/BLUE SHIELD | Source: Ambulatory Visit | Attending: Student | Admitting: Student

## 2022-09-01 DIAGNOSIS — M7581 Other shoulder lesions, right shoulder: Secondary | ICD-10-CM

## 2022-09-01 DIAGNOSIS — M19011 Primary osteoarthritis, right shoulder: Secondary | ICD-10-CM

## 2022-09-01 DIAGNOSIS — M7521 Bicipital tendinitis, right shoulder: Secondary | ICD-10-CM
